# Patient Record
Sex: Male | Born: 1950 | Race: Black or African American | Hispanic: No | Marital: Single | Smoking: Former smoker
Health system: Southern US, Community
[De-identification: ages and names within clinical notes are randomized; demographics above are authoritative.]

## PROBLEM LIST (undated history)

## (undated) DIAGNOSIS — E785 Hyperlipidemia, unspecified: Secondary | ICD-10-CM

## (undated) DIAGNOSIS — N529 Male erectile dysfunction, unspecified: Secondary | ICD-10-CM

## (undated) DIAGNOSIS — I10 Essential (primary) hypertension: Secondary | ICD-10-CM

## (undated) HISTORY — DX: Essential (primary) hypertension: I10

## (undated) HISTORY — DX: Male erectile dysfunction, unspecified: N52.9

## (undated) HISTORY — DX: Hyperlipidemia, unspecified: E78.5

---

## 2007-04-19 ENCOUNTER — Ambulatory Visit: Payer: Self-pay | Admitting: Gastroenterology

## 2010-06-02 HISTORY — PX: COLONOSCOPY: SHX174

## 2014-11-28 ENCOUNTER — Encounter: Payer: Self-pay | Admitting: Family Medicine

## 2014-11-28 ENCOUNTER — Ambulatory Visit (INDEPENDENT_AMBULATORY_CARE_PROVIDER_SITE_OTHER): Payer: Self-pay | Admitting: Family Medicine

## 2014-11-28 VITALS — BP 120/80 | HR 72 | Ht 69.0 in | Wt 173.0 lb

## 2014-11-28 DIAGNOSIS — N529 Male erectile dysfunction, unspecified: Secondary | ICD-10-CM

## 2014-11-28 DIAGNOSIS — E785 Hyperlipidemia, unspecified: Secondary | ICD-10-CM | POA: Insufficient documentation

## 2014-11-28 DIAGNOSIS — I10 Essential (primary) hypertension: Secondary | ICD-10-CM

## 2014-11-28 MED ORDER — HYDROCHLOROTHIAZIDE 25 MG PO TABS: 25.0000 mg | ORAL_TABLET | Freq: Every day | ORAL | Status: DC

## 2014-11-28 MED ORDER — SILDENAFIL CITRATE 100 MG PO TABS: 50.0000 mg | ORAL_TABLET | Freq: Every day | ORAL | Status: DC | PRN

## 2014-11-28 MED ORDER — LOVASTATIN 20 MG PO TABS: 20.0000 mg | ORAL_TABLET | Freq: Every day | ORAL | Status: DC

## 2014-11-28 NOTE — Progress Notes (Signed)
Name: Fred Cordova   MRN: [id number]    DOB: Aug 26, 1950   Date:11/28/2014       Progress Note  Subjective  Chief Complaint  Chief Complaint  Patient presents with  . Hypertension  . Hyperlipidemia    Hypertension This is a chronic problem. The current episode started more than 1 year ago. The problem has been resolved since onset. The problem is controlled. Pertinent negatives include no anxiety, blurred vision, chest pain, headaches, malaise/fatigue, neck pain, orthopnea, palpitations, peripheral edema, PND, shortness of breath or sweats. There are no associated agents to hypertension. There are no known risk factors for coronary artery disease. Past treatments include nothing. The current treatment provides no improvement. There are no compliance problems.  There is no history of angina, kidney disease, CAD/MI, CVA, heart failure, left ventricular hypertrophy or PVD. There is no history of chronic renal disease.  Hyperlipidemia This is a recurrent problem. The current episode started more than 1 year ago. The problem is controlled. Recent lipid tests were reviewed and are low. He has no history of chronic renal disease, diabetes, hypothyroidism, liver disease, obesity or nephrotic syndrome. There are no known factors aggravating his hyperlipidemia. Pertinent negatives include no chest pain, focal sensory loss, focal weakness, leg pain, myalgias or shortness of breath. He is currently on no antihyperlipidemic treatment. The current treatment provides mild improvement of lipids. There are no compliance problems.  There are no known risk factors for coronary artery disease.    No problem-specific assessment & plan notes found for this encounter.   Past Medical History  Diagnosis Date  . Hypertension   . Hyperlipidemia     Past Surgical History  Procedure Laterality Date  . Colonoscopy  2012    repeat in [facility]    History reviewed. No pertinent family history.  History   Social  History  . Marital Status: Single    Spouse Name: N/A  . Number of Children: N/A  . Years of Education: N/A   Occupational History  . Not on file.   Social History Main Topics  . Smoking status: Former Research scientist (life sciences)  . Smokeless tobacco: Not on file  . Alcohol Use: 0.0 oz/week    0 Standard drinks or equivalent per week  . Drug Use: No  . Sexual Activity: No   Other Topics Concern  . Not on file   Social History Narrative  . No narrative on file    No Known Allergies   Review of Systems  Constitutional: Negative for fever, chills, weight loss and malaise/fatigue.  HENT: Negative for ear discharge, ear pain and sore throat.   Eyes: Negative for blurred vision.  Respiratory: Negative for cough, sputum production, shortness of breath and wheezing.   Cardiovascular: Negative for chest pain, palpitations, orthopnea, leg swelling and PND.  Gastrointestinal: Negative for heartburn, nausea, abdominal pain, diarrhea, constipation, blood in stool and melena.  Genitourinary: Negative for dysuria, urgency, frequency and hematuria.  Musculoskeletal: Negative for myalgias, back pain, joint pain and neck pain.  Skin: Negative for rash.  Neurological: Negative for dizziness, tingling, sensory change, focal weakness and headaches.  Endo/Heme/Allergies: Negative for environmental allergies and polydipsia. Does not bruise/bleed easily.  Psychiatric/Behavioral: Negative for depression and suicidal ideas. The patient is not nervous/anxious and does not have insomnia.      Objective  Filed Vitals:   11/28/14 1339  BP: 120/80  Pulse: 72  Height: 5\' 9"  (1.753 m)  Weight: 173 lb (78.472 kg)    Physical  Exam  Constitutional: He is oriented to person, place, and time and well-developed, well-nourished, and in no distress.  HENT:  Head: Normocephalic.  Right Ear: External ear normal.  Left Ear: External ear normal.  Nose: Nose normal.  Mouth/Throat: Oropharynx is clear and moist.  Eyes:  Conjunctivae and EOM are normal. Pupils are equal, round, and reactive to light. Right eye exhibits no discharge. Left eye exhibits no discharge. No scleral icterus.  Neck: Normal range of motion. Neck supple. No JVD present. No tracheal deviation present. No thyromegaly present.  Cardiovascular: Normal rate, regular rhythm, normal heart sounds and intact distal pulses.  Exam reveals no gallop and no friction rub.   No murmur heard. Pulmonary/Chest: Breath sounds normal. No respiratory distress. He has no wheezes. He has no rales.  Abdominal: Soft. Bowel sounds are normal. He exhibits no mass. There is no hepatosplenomegaly. There is no tenderness. There is no rebound, no guarding and no CVA tenderness.  Musculoskeletal: Normal range of motion. He exhibits no edema or tenderness.  Lymphadenopathy:    He has no cervical adenopathy.  Neurological: He is alert and oriented to person, place, and time. He has normal sensation, normal strength, normal reflexes and intact cranial nerves. No cranial nerve deficit.  Skin: Skin is warm. No rash noted.  Psychiatric: Mood and affect normal.  Nursing note and vitals reviewed.     Assessment & Plan  Problem List Items Addressed This Visit      Cardiovascular and Mediastinum   Hypertension - Primary   Relevant Medications   aspirin 81 MG tablet   hydrochlorothiazide (HYDRODIURIL) 25 MG tablet   lovastatin (MEVACOR) 20 MG tablet   sildenafil (VIAGRA) 100 MG tablet   Other Relevant Orders   Renal Function Panel   POCT Urinalysis Dipstick     Other   Hyperlipidemia   Relevant Medications   aspirin 81 MG tablet   hydrochlorothiazide (HYDRODIURIL) 25 MG tablet   lovastatin (MEVACOR) 20 MG tablet   sildenafil (VIAGRA) 100 MG tablet   Other Relevant Orders   Lipid Profile    Other Visit Diagnoses    Erectile dysfunction, unspecified erectile dysfunction type        Relevant Medications    sildenafil (VIAGRA) 100 MG tablet         Dr.  [facility]  11/28/2014

## 2014-11-29 LAB — RENAL FUNCTION PANEL
Albumin: 4.4 g/dL (ref 3.6–4.8)
BUN/Creatinine Ratio: 16 (ref 10–22)
BUN: 16 mg/dL (ref 8–27)
CALCIUM: 9.4 mg/dL (ref 8.6–10.2)
CHLORIDE: 99 mmol/L (ref 97–108)
CO2: 27 mmol/L (ref 18–29)
CREATININE: 1 mg/dL (ref 0.76–1.27)
GFR calc Af Amer: 92 mL/min/{1.73_m2} (ref >59)
GFR calc non Af Amer: 80 mL/min/{1.73_m2} (ref >59)
Glucose: 109 mg/dL — ABNORMAL HIGH (ref 65–99)
Phosphorus: 3.6 mg/dL (ref 2.5–4.5)
Potassium: 3.9 mmol/L (ref 3.5–5.2)
Sodium: 143 mmol/L (ref 134–144)

## 2014-11-29 LAB — LIPID PANEL
Chol/HDL Ratio: 2.4 ratio units (ref 0.0–5.0)
Cholesterol, Total: 204 mg/dL — ABNORMAL HIGH (ref 100–199)
HDL: 84 mg/dL (ref >39)
LDL Calculated: 84 mg/dL (ref 0–99)
Triglycerides: 179 mg/dL — ABNORMAL HIGH (ref 0–149)
VLDL CHOLESTEROL CAL: 36 mg/dL (ref 5–40)

## 2014-12-05 ENCOUNTER — Other Ambulatory Visit: Payer: Self-pay | Admitting: Family Medicine

## 2015-06-28 ENCOUNTER — Encounter: Payer: Self-pay | Admitting: Family Medicine

## 2015-06-28 ENCOUNTER — Ambulatory Visit (INDEPENDENT_AMBULATORY_CARE_PROVIDER_SITE_OTHER): Payer: Self-pay | Admitting: Family Medicine

## 2015-06-28 VITALS — BP 122/70 | HR 64 | Ht 69.0 in | Wt 172.0 lb

## 2015-06-28 DIAGNOSIS — N529 Male erectile dysfunction, unspecified: Secondary | ICD-10-CM

## 2015-06-28 DIAGNOSIS — N5201 Erectile dysfunction due to arterial insufficiency: Secondary | ICD-10-CM | POA: Insufficient documentation

## 2015-06-28 DIAGNOSIS — E785 Hyperlipidemia, unspecified: Secondary | ICD-10-CM

## 2015-06-28 DIAGNOSIS — I10 Essential (primary) hypertension: Secondary | ICD-10-CM

## 2015-06-28 MED ORDER — ASPIRIN 81 MG PO TABS: 81.0000 mg | ORAL_TABLET | Freq: Every day | ORAL | Status: AC

## 2015-06-28 MED ORDER — SILDENAFIL CITRATE 100 MG PO TABS: 50.0000 mg | ORAL_TABLET | Freq: Every day | ORAL | Status: DC | PRN

## 2015-06-28 MED ORDER — HYDROCHLOROTHIAZIDE 25 MG PO TABS: 25.0000 mg | ORAL_TABLET | Freq: Every day | ORAL | Status: DC

## 2015-06-28 MED ORDER — LOVASTATIN 20 MG PO TABS: 20.0000 mg | ORAL_TABLET | Freq: Every day | ORAL | Status: DC

## 2015-06-28 NOTE — Progress Notes (Signed)
Name: Fred Cordova   MRN: [id number]    DOB: April 10, 1951   Date:06/28/2015       Progress Note  Subjective  Chief Complaint  Chief Complaint  Patient presents with  . Hypertension  . Hyperlipidemia  . Erectile Dysfunction    Hypertension This is a chronic problem. The current episode started more than 1 year ago. The problem has been gradually improving since onset. The problem is controlled. Pertinent negatives include no anxiety, blurred vision, chest pain, headaches, malaise/fatigue, neck pain, orthopnea, palpitations, peripheral edema, PND, shortness of breath or sweats. There are no associated agents to hypertension. There are no known risk factors for coronary artery disease. Past treatments include diuretics. The current treatment provides moderate improvement. There are no compliance problems.  There is no history of angina, kidney disease, CAD/MI, CVA, heart failure, left ventricular hypertrophy, PVD, renovascular disease or retinopathy. There is no history of chronic renal disease or a hypertension causing med.  Hyperlipidemia This is a recurrent problem. The current episode started more than 1 year ago. The problem is controlled. Recent lipid tests were reviewed and are normal. He has no history of chronic renal disease, diabetes, hypothyroidism, liver disease, obesity or nephrotic syndrome. There are no known factors aggravating his hyperlipidemia. Pertinent negatives include no chest pain, focal sensory loss, focal weakness, leg pain, myalgias or shortness of breath. Current antihyperlipidemic treatment includes diet change and statins. The current treatment provides mild improvement of lipids.  Erectile Dysfunction This is a chronic problem. The current episode started more than 1 year ago. The problem has been gradually improving since onset. The nature of his difficulty is achieving erection. He reports no anxiety, decreased libido or performance anxiety. Irritative symptoms do not  include frequency or urgency. Pertinent negatives include no chills, dysuria or hematuria.    No problem-specific assessment & plan notes found for this encounter.   Past Medical History  Diagnosis Date  . Hypertension   . Hyperlipidemia   . Erectile dysfunction     Past Surgical History  Procedure Laterality Date  . Colonoscopy  2012    repeat in 2017[facility]    History reviewed. No pertinent family history.  Social History   Social History  . Marital Status: Single    Spouse Name: N/A  . Number of Children: N/A  . Years of Education: N/A   Occupational History  . Not on file.   Social History Main Topics  . Smoking status: Former Research scientist (life sciences)  . Smokeless tobacco: Not on file  . Alcohol Use: 0.0 oz/week    0 Standard drinks or equivalent per week  . Drug Use: No  . Sexual Activity: No   Other Topics Concern  . Not on file   Social History Narrative    No Known Allergies   Review of Systems  Constitutional: Negative for fever, chills, weight loss and malaise/fatigue.  HENT: Negative for ear discharge, ear pain and sore throat.   Eyes: Negative for blurred vision.  Respiratory: Negative for cough, sputum production, shortness of breath and wheezing.   Cardiovascular: Negative for chest pain, palpitations, orthopnea, leg swelling and PND.  Gastrointestinal: Negative for heartburn, nausea, abdominal pain, diarrhea, constipation, blood in stool and melena.  Genitourinary: Negative for dysuria, urgency, frequency, hematuria and decreased libido.  Musculoskeletal: Negative for myalgias, back pain, joint pain and neck pain.  Skin: Negative for rash.  Neurological: Negative for dizziness, tingling, sensory change, focal weakness and headaches.  Endo/Heme/Allergies: Negative for environmental allergies and  polydipsia. Does not bruise/bleed easily.  Psychiatric/Behavioral: Negative for depression and suicidal ideas. The patient is not nervous/anxious and does not have  insomnia.      Objective  Filed Vitals:   06/28/15 1019  BP: 122/70  Pulse: 64  Height: 5\' 9"  (1.753 m)  Weight: 172 lb (78.019 kg)    Physical Exam  Constitutional: He is oriented to person, place, and time and well-developed, well-nourished, and in no distress.  HENT:  Head: Normocephalic.  Right Ear: External ear normal.  Left Ear: External ear normal.  Nose: Nose normal.  Mouth/Throat: Oropharynx is clear and moist.  Eyes: Conjunctivae and EOM are normal. Pupils are equal, round, and reactive to light. Right eye exhibits no discharge. Left eye exhibits no discharge. No scleral icterus.  Neck: Normal range of motion. Neck supple. No JVD present. No tracheal deviation present. No thyromegaly present.  Cardiovascular: Normal rate, regular rhythm, normal heart sounds and intact distal pulses.  Exam reveals no gallop and no friction rub.   No murmur heard. Pulmonary/Chest: Breath sounds normal. No respiratory distress. He has no wheezes. He has no rales.  Abdominal: Soft. Bowel sounds are normal. He exhibits no mass. There is no hepatosplenomegaly. There is no tenderness. There is no rebound, no guarding and no CVA tenderness.  Musculoskeletal: Normal range of motion. He exhibits no edema or tenderness.  Lymphadenopathy:    He has no cervical adenopathy.  Neurological: He is alert and oriented to person, place, and time. He has normal sensation, normal strength and intact cranial nerves. No cranial nerve deficit.  Skin: Skin is warm. No rash noted.  Psychiatric: Mood and affect normal.  Nursing note and vitals reviewed.     Assessment & Plan  Problem List Items Addressed This Visit      Cardiovascular and Mediastinum   Hypertension - Primary   Relevant Medications   hydrochlorothiazide (HYDRODIURIL) 25 MG tablet   lovastatin (MEVACOR) 20 MG tablet   sildenafil (VIAGRA) 100 MG tablet   aspirin 81 MG tablet   Other Relevant Orders   Renal Function Panel   Erectile  dysfunction due to arterial insufficiency   Relevant Medications   hydrochlorothiazide (HYDRODIURIL) 25 MG tablet   lovastatin (MEVACOR) 20 MG tablet   sildenafil (VIAGRA) 100 MG tablet   aspirin 81 MG tablet     Other   Hyperlipidemia   Relevant Medications   hydrochlorothiazide (HYDRODIURIL) 25 MG tablet   lovastatin (MEVACOR) 20 MG tablet   sildenafil (VIAGRA) 100 MG tablet   aspirin 81 MG tablet   Other Relevant Orders   Lipid Profile    Other Visit Diagnoses    Erectile dysfunction, unspecified erectile dysfunction type        Relevant Medications    sildenafil (VIAGRA) 100 MG tablet         Dr. [facility]  06/28/2015

## 2015-11-28 ENCOUNTER — Other Ambulatory Visit: Payer: Self-pay

## 2015-11-28 DIAGNOSIS — I10 Essential (primary) hypertension: Secondary | ICD-10-CM

## 2015-11-28 DIAGNOSIS — E785 Hyperlipidemia, unspecified: Secondary | ICD-10-CM

## 2015-11-28 MED ORDER — LOVASTATIN 20 MG PO TABS: 20.0000 mg | ORAL_TABLET | Freq: Every day | ORAL | Status: DC

## 2015-11-28 MED ORDER — HYDROCHLOROTHIAZIDE 25 MG PO TABS: 25.0000 mg | ORAL_TABLET | Freq: Every day | ORAL | Status: DC

## 2015-12-06 ENCOUNTER — Encounter: Payer: Self-pay | Admitting: Family Medicine

## 2015-12-06 ENCOUNTER — Ambulatory Visit (INDEPENDENT_AMBULATORY_CARE_PROVIDER_SITE_OTHER): Payer: Self-pay | Admitting: Family Medicine

## 2015-12-06 VITALS — BP 130/80 | HR 64 | Ht 69.0 in | Wt 170.0 lb

## 2015-12-06 DIAGNOSIS — N5201 Erectile dysfunction due to arterial insufficiency: Secondary | ICD-10-CM

## 2015-12-06 DIAGNOSIS — I10 Essential (primary) hypertension: Secondary | ICD-10-CM

## 2015-12-06 DIAGNOSIS — Z1211 Encounter for screening for malignant neoplasm of colon: Secondary | ICD-10-CM

## 2015-12-06 DIAGNOSIS — N529 Male erectile dysfunction, unspecified: Secondary | ICD-10-CM

## 2015-12-06 DIAGNOSIS — E785 Hyperlipidemia, unspecified: Secondary | ICD-10-CM

## 2015-12-06 LAB — HEMOCCULT GUIAC POC 1CARD (OFFICE): Fecal Occult Blood, POC: NEGATIVE

## 2015-12-06 MED ORDER — HYDROCHLOROTHIAZIDE 25 MG PO TABS: 25.0000 mg | ORAL_TABLET | Freq: Every day | ORAL | Status: DC

## 2015-12-06 MED ORDER — LOVASTATIN 20 MG PO TABS: 20.0000 mg | ORAL_TABLET | Freq: Every day | ORAL | Status: DC

## 2015-12-06 MED ORDER — SILDENAFIL CITRATE 100 MG PO TABS: 50.0000 mg | ORAL_TABLET | Freq: Every day | ORAL | Status: DC | PRN

## 2015-12-06 NOTE — Progress Notes (Signed)
Name: Fred Cordova   MRN: [id number]    DOB: 13-Nov-1950   Date:12/06/2015       Progress Note  Subjective  Chief Complaint  Chief Complaint  Patient presents with  . Hypertension  . Hyperlipidemia    Hypertension This is a chronic problem. The current episode started more than 1 year ago. The problem has been gradually worsening since onset. The problem is controlled. Pertinent negatives include no anxiety, blurred vision, chest pain, headaches, malaise/fatigue, neck pain, orthopnea, palpitations, peripheral edema, PND, shortness of breath or sweats. There are no associated agents to hypertension. Risk factors for coronary artery disease include dyslipidemia. Past treatments include diuretics. The current treatment provides mild improvement. There are no compliance problems.  There is no history of angina, kidney disease, CAD/MI, CVA, heart failure, left ventricular hypertrophy, PVD, renovascular disease or retinopathy. There is no history of chronic renal disease or a hypertension causing med.  Hyperlipidemia This is a chronic problem. The current episode started more than 1 year ago. The problem is controlled. Recent lipid tests were reviewed and are normal. He has no history of chronic renal disease, diabetes, hypothyroidism, liver disease, obesity or nephrotic syndrome. There are no known factors aggravating his hyperlipidemia. Pertinent negatives include no chest pain, focal sensory loss, focal weakness, leg pain, myalgias or shortness of breath. Current antihyperlipidemic treatment includes statins. The current treatment provides mild improvement of lipids. There are no compliance problems.  Risk factors for coronary artery disease include hypertension and dyslipidemia.    No problem-specific assessment & plan notes found for this encounter.   Past Medical History  Diagnosis Date  . Hypertension   . Hyperlipidemia   . Erectile dysfunction     Past Surgical History  Procedure  Laterality Date  . Colonoscopy  2012    [facility]    History reviewed. No pertinent family history.  Social History   Social History  . Marital Status: Single    Spouse Name: N/A  . Number of Children: N/A  . Years of Education: N/A   Occupational History  . Not on file.   Social History Main Topics  . Smoking status: Former Research scientist (life sciences)  . Smokeless tobacco: Not on file  . Alcohol Use: 0.0 oz/week    0 Standard drinks or equivalent per week  . Drug Use: No  . Sexual Activity: No   Other Topics Concern  . Not on file   Social History Narrative    No Known Allergies   Review of Systems  Constitutional: Negative for fever, chills, weight loss and malaise/fatigue.  HENT: Negative for ear discharge, ear pain and sore throat.   Eyes: Negative for blurred vision.  Respiratory: Negative for cough, sputum production, shortness of breath and wheezing.   Cardiovascular: Negative for chest pain, palpitations, orthopnea, leg swelling and PND.  Gastrointestinal: Negative for heartburn, nausea, abdominal pain, diarrhea, constipation, blood in stool and melena.  Genitourinary: Negative for dysuria, urgency, frequency and hematuria.  Musculoskeletal: Negative for myalgias, back pain, joint pain and neck pain.  Skin: Negative for rash.  Neurological: Negative for dizziness, tingling, sensory change, focal weakness and headaches.  Endo/Heme/Allergies: Negative for environmental allergies and polydipsia. Does not bruise/bleed easily.  Psychiatric/Behavioral: Negative for depression and suicidal ideas. The patient is not nervous/anxious and does not have insomnia.      Objective  Filed Vitals:   12/06/15 1045  BP: 130/80  Pulse: 64  Height: 5\' 9"  (1.753 m)  Weight: 170 lb (77.111  kg)    Physical Exam  Constitutional: He is oriented to person, place, and time and well-developed, well-nourished, and in no distress.  HENT:  Head: Normocephalic.  Right Ear: External ear  normal.  Left Ear: External ear normal.  Nose: Nose normal.  Mouth/Throat: Oropharynx is clear and moist.  Eyes: Conjunctivae and EOM are normal. Pupils are equal, round, and reactive to light. Right eye exhibits no discharge. Left eye exhibits no discharge. No scleral icterus.  Neck: Normal range of motion. Neck supple. No JVD present. No tracheal deviation present. No thyromegaly present.  Cardiovascular: Normal rate, regular rhythm, normal heart sounds and intact distal pulses.  Exam reveals no gallop and no friction rub.   No murmur heard. Pulmonary/Chest: Breath sounds normal. No respiratory distress. He has no wheezes. He has no rales.  Abdominal: Soft. Bowel sounds are normal. He exhibits no mass. There is no hepatosplenomegaly. There is no tenderness. There is no rebound, no guarding and no CVA tenderness.  Genitourinary: Prostate normal. Guaiac negative stool.  Musculoskeletal: Normal range of motion. He exhibits no edema or tenderness.  Lymphadenopathy:    He has no cervical adenopathy.  Neurological: He is alert and oriented to person, place, and time. He has normal sensation, normal strength, normal reflexes and intact cranial nerves. No cranial nerve deficit.  Skin: Skin is warm. No rash noted.  Psychiatric: Mood and affect normal.  Nursing note and vitals reviewed.     Assessment & Plan  Problem List Items Addressed This Visit      Cardiovascular and Mediastinum   Hypertension - Primary   Relevant Medications   lovastatin (MEVACOR) 20 MG tablet   hydrochlorothiazide (HYDRODIURIL) 25 MG tablet   sildenafil (VIAGRA) 100 MG tablet   Other Relevant Orders   Basic metabolic panel   Erectile dysfunction due to arterial insufficiency   Relevant Medications   lovastatin (MEVACOR) 20 MG tablet   hydrochlorothiazide (HYDRODIURIL) 25 MG tablet   sildenafil (VIAGRA) 100 MG tablet     Other   Hyperlipidemia   Relevant Medications   lovastatin (MEVACOR) 20 MG tablet    hydrochlorothiazide (HYDRODIURIL) 25 MG tablet   sildenafil (VIAGRA) 100 MG tablet   Other Relevant Orders   Lipid Profile    Other Visit Diagnoses    Erectile dysfunction, unspecified erectile dysfunction type        Relevant Medications    sildenafil (VIAGRA) 100 MG tablet    Colon cancer screening        Relevant Orders    POCT Occult Blood Stool (Completed)         Dr. Deanna [facility]  12/06/2015

## 2015-12-08 LAB — LIPID PANEL
CHOL/HDL RATIO: 3.1 ratio (ref 0.0–5.0)
Cholesterol, Total: 270 mg/dL — ABNORMAL HIGH (ref 100–199)
HDL: 88 mg/dL (ref >39)
LDL Calculated: 159 mg/dL — ABNORMAL HIGH (ref 0–99)
TRIGLYCERIDES: 113 mg/dL (ref 0–149)
VLDL CHOLESTEROL CAL: 23 mg/dL (ref 5–40)

## 2015-12-08 LAB — BASIC METABOLIC PANEL
BUN / CREAT RATIO: 18 (ref 10–24)
BUN: 20 mg/dL (ref 8–27)
CHLORIDE: 96 mmol/L (ref 96–106)
CO2: 26 mmol/L (ref 18–29)
Calcium: 9.9 mg/dL (ref 8.6–10.2)
Creatinine, Ser: 1.11 mg/dL (ref 0.76–1.27)
GFR calc non Af Amer: 70 mL/min/{1.73_m2} (ref >59)
GFR, EST AFRICAN AMERICAN: 81 mL/min/{1.73_m2} (ref >59)
GLUCOSE: 95 mg/dL (ref 65–99)
POTASSIUM: 4.5 mmol/L (ref 3.5–5.2)
Sodium: 141 mmol/L (ref 134–144)

## 2015-12-31 ENCOUNTER — Other Ambulatory Visit: Payer: Self-pay

## 2016-01-18 ENCOUNTER — Emergency Department
Admission: EM | Admit: 2016-01-18 | Discharge: 2016-01-18 | Disposition: A | Discharge status: Home / Self Care | Payer: Medicare Other | Attending: Emergency Medicine | Admitting: Emergency Medicine

## 2016-01-18 ENCOUNTER — Emergency Department: Payer: Medicare Other

## 2016-01-18 ENCOUNTER — Encounter: Payer: Self-pay | Admitting: Emergency Medicine

## 2016-01-18 DIAGNOSIS — Z79899 Other long term (current) drug therapy: Secondary | ICD-10-CM | POA: Insufficient documentation

## 2016-01-18 DIAGNOSIS — Z87891 Personal history of nicotine dependence: Secondary | ICD-10-CM | POA: Insufficient documentation

## 2016-01-18 DIAGNOSIS — Y939 Activity, unspecified: Secondary | ICD-10-CM | POA: Insufficient documentation

## 2016-01-18 DIAGNOSIS — Y999 Unspecified external cause status: Secondary | ICD-10-CM | POA: Insufficient documentation

## 2016-01-18 DIAGNOSIS — I10 Essential (primary) hypertension: Secondary | ICD-10-CM | POA: Insufficient documentation

## 2016-01-18 DIAGNOSIS — Y929 Unspecified place or not applicable: Secondary | ICD-10-CM | POA: Insufficient documentation

## 2016-01-18 DIAGNOSIS — S0121XA Laceration without foreign body of nose, initial encounter: Secondary | ICD-10-CM | POA: Diagnosis not present

## 2016-01-18 DIAGNOSIS — Z7982 Long term (current) use of aspirin: Secondary | ICD-10-CM | POA: Diagnosis not present

## 2016-01-18 DIAGNOSIS — I6782 Cerebral ischemia: Secondary | ICD-10-CM | POA: Diagnosis not present

## 2016-01-18 DIAGNOSIS — W1839XA Other fall on same level, initial encounter: Secondary | ICD-10-CM | POA: Insufficient documentation

## 2016-01-18 DIAGNOSIS — S0990XA Unspecified injury of head, initial encounter: Secondary | ICD-10-CM | POA: Diagnosis present

## 2016-01-18 MED ORDER — LIDOCAINE HCL (PF) 1 % IJ SOLN
5.0000 mL | Freq: Once | INTRAMUSCULAR | Status: AC
  Administered 2016-01-18: 5 mL via INTRADERMAL
  Filled 2016-01-18: qty 5

## 2016-01-18 MED ORDER — PENTAFLUOROPROP-TETRAFLUOROETH EX AERO
INHALATION_SPRAY | CUTANEOUS | Status: DC | PRN
  Administered 2016-01-18: 30 via TOPICAL
  Filled 2016-01-18: qty 30

## 2016-01-18 MED ORDER — PENTAFLUOROPROP-TETRAFLUOROETH EX AERO
INHALATION_SPRAY | CUTANEOUS | Status: AC
  Administered 2016-01-18: 30 via TOPICAL
  Filled 2016-01-18: qty 30

## 2016-01-18 MED ORDER — LIDOCAINE HCL (PF) 1 % IJ SOLN
INTRAMUSCULAR | Status: AC
  Administered 2016-01-18: 5 mL via INTRADERMAL
  Filled 2016-01-18: qty 5

## 2016-01-18 NOTE — ED Triage Notes (Addendum)
Patient ambulatory to triage with steady gait, without difficulty or distress noted; pt reports "I drank too many beers and I fell down"; denies any pain but reports "split my nose"; lac noted to left side nare with scant bleeding; dressing in place

## 2016-01-18 NOTE — ED Provider Notes (Signed)
[facility] Emergency Department Provider Note  ____________________________________________   First MD Initiated Contact with Patient 01/18/16 [phone]     (approximate)  I have reviewed the triage vital signs and the nursing notes.   HISTORY  Chief Complaint Laceration    HPI Fred Cordova is a 65 y.o. male presents with nose laceration status post fall. Patient states "I drank too many beers and I filled out". Patient denies any loss of consciousness.   Past Medical History:  Diagnosis Date  . Erectile dysfunction   . Hyperlipidemia   . Hypertension     Patient Active Problem List   Diagnosis Date Noted  . Erectile dysfunction due to arterial insufficiency 06/28/2015  . Hypertension 11/28/2014  . Hyperlipidemia 11/28/2014    Past Surgical History:  Procedure Laterality Date  . COLONOSCOPY  2012   [facility] in 2017[facility]    Prior to Admission medications   Medication Sig Start Date End Date Taking? Authorizing Provider  aspirin 81 MG tablet Take 1 tablet (81 mg total) by mouth daily. 06/28/15   Juline Patch, MD  hydrochlorothiazide (HYDRODIURIL) 25 MG tablet Take 1 tablet (25 mg total) by mouth daily. 12/06/15   Juline Patch, MD  lovastatin (MEVACOR) 20 MG tablet Take 1 tablet (20 mg total) by mouth at bedtime. 12/06/15   Juline Patch, MD  sildenafil (VIAGRA) 100 MG tablet Take 0.5 tablets (50 mg total) by mouth daily as needed for erectile dysfunction. 12/06/15   Juline Patch, MD    Allergies Review of patient's allergies indicates no known allergies.  No family history on file.  Social History Social History  Substance Use Topics  . Smoking status: Former Research scientist (life sciences)  . Smokeless tobacco: Never Used  . Alcohol use 0.0 oz/week    Review of Systems Constitutional: No fever/chills Eyes: No visual changes. ENT: No sore throat. Cardiovascular: Denies chest pain. Respiratory: Denies shortness of breath. Gastrointestinal: No abdominal  pain.  No nausea, no vomiting.  No diarrhea.  No constipation. Genitourinary: Negative for dysuria. Musculoskeletal: Negative for back pain. Skin: Negative for rash. Positive for nose laceration Neurological: Negative for headaches, focal weakness or numbness.  10-point ROS otherwise negative.  ____________________________________________   PHYSICAL EXAM:  VITAL SIGNS: ED Triage Vitals [01/18/16 0128]  Enc Vitals Group     BP 139/80     Pulse Rate 68     Resp 20     Temp 97.9 F (36.6 C)     Temp Source Oral     SpO2 100 %     Weight 165 lb (74.8 kg)     Height 5\' 9"  (1.753 m)     Head Circumference      Peak Flow      Pain Score      Pain Loc      Pain Edu?      Excl. in [place]?     Constitutional: Alert and oriented. Well appearing and in no acute distress. Eyes: Conjunctivae are normal. PERRL. EOMI. Head: Atraumatic. Ears:  Healthy appearing ear canals and TMs bilaterally Nose: No congestion/rhinnorhea.3 cm left Nare linear laceration Mouth/Throat: Mucous membranes are moist.  Oropharynx non-erythematous. Neck: No stridor.  No meningeal signs.   Cardiovascular: Normal rate, regular rhythm. Good peripheral circulation. Grossly normal heart sounds.   Respiratory: Normal respiratory effort.  No retractions. Lungs CTAB. Gastrointestinal: Soft and nontender. No distention.  Musculoskeletal: No lower extremity tenderness nor edema. No gross deformities of extremities. Neurologic:  Normal speech and language. No gross focal neurologic deficits are appreciated.  Skin:  Skin is warm, dry and intact. No rash noted. Psychiatric: Mood and affect are normal. Speech and behavior are normal.    Labs Reviewed - No data to display   RADIOLOGY I, Hyde, personally viewed and evaluated these images (plain radiographs) as part of my medical decision making, as well as reviewing the written report by the radiologist  Ct Head Wo Contrast  Result Date: 01/18/2016 CLINICAL  DATA:  Status post fall, with laceration at the left side of the nose. Initial encounter. EXAM: CT HEAD WITHOUT CONTRAST TECHNIQUE: Contiguous axial images were obtained from the base of the skull through the vertex without intravenous contrast. COMPARISON:  None. FINDINGS: Brain: No evidence of acute infarction, hemorrhage, hydrocephalus, extra-axial collection or mass lesion/mass effect. Mild periventricular white matter likely reflects small vessel ischemic microangiopathy. The posterior fossa, including the cerebellum, brainstem and fourth ventricle, is within normal limits. The third and lateral ventricles, and basal ganglia are unremarkable in appearance. The cerebral hemispheres are symmetric in appearance, with normal gray-white differentiation. No mass effect or midline shift is seen. Vascular: No hyperdense vessel or unexpected calcification. Skull: There is no evidence of fracture; visualized osseous structures are unremarkable in appearance. Sinuses/Orbits: The visualized portions of the orbits are within normal limits. The paranasal sinuses and mastoid air cells are well-aerated. Other: No significant soft tissue abnormalities are seen. IMPRESSION: 1. No evidence of traumatic intracranial injury or fracture. 2. Mild small vessel ischemic microangiopathy. Electronically Signed   By: Garald Balding M.D.   On: 01/18/2016 03:34     .Marland KitchenLaceration Repair Date/Time: 01/18/2016 4:51 AM Performed by: Gregor Hams Authorized by: Gregor Hams   Consent:    Consent obtained:  Verbal   Consent given by:  Patient   Risks discussed:  Poor cosmetic result   Alternatives discussed:  No treatment Anesthesia (see MAR for exact dosages):    Anesthesia method:  Local infiltration   Local anesthetic:  Lidocaine 1% w/o epi Laceration details:    Location:  Face   Face location:  Nose   Length (cm):  3   Depth (mm):  1 Repair type:    Repair type:  Complex Pre-procedure details:    Preparation:   Patient was prepped and draped in usual sterile fashion Exploration:    Contaminated: no   Treatment:    Area cleansed with:  Betadine and saline   Amount of cleaning:  Standard   Scar revision: no   Skin repair:    Repair method:  Sutures   Suture size:  6-0   Suture material:  Nylon Approximation:    Approximation:  Close   Vermilion border: well-aligned   Post-procedure details:    Patient tolerance of procedure:  Tolerated well, no immediate complications Comments:     Laceration through the nasal cartilage as such patient referred to ENT for follow-up       INITIAL IMPRESSION / [place] / ED COURSE  Pertinent labs & imaging results that were available during my care of the patient were reviewed by me and considered in my medical decision making (see chart for details).    Clinical Course    ____________________________________________  FINAL CLINICAL IMPRESSION(S) / ED DIAGNOSES  Final diagnoses:  Nasal laceration, initial encounter     MEDICATIONS GIVEN DURING THIS VISIT:  Medications  pentafluoroprop-tetrafluoroeth (GEBAUERS) aerosol (30 application Topical Given 01/18/16 0235)  lidocaine (PF) (XYLOCAINE)  1 % injection 5 mL (5 mLs Intradermal Given 01/18/16 0235)     NEW OUTPATIENT MEDICATIONS STARTED DURING THIS VISIT:  New Prescriptions   No medications on file      Note:  This document was prepared using Dragon voice recognition software and may include unintentional dictation errors.    Gregor Hams, MD 01/18/16 [phone]

## 2016-01-18 NOTE — ED Notes (Signed)
MD at bedside. Suture cart in room. 6 sutures placed in pts left nostril per Owens Shark, MD.

## 2016-01-23 DIAGNOSIS — S0121XA Laceration without foreign body of nose, initial encounter: Secondary | ICD-10-CM | POA: Diagnosis not present

## 2016-03-24 ENCOUNTER — Other Ambulatory Visit: Payer: Self-pay

## 2016-03-31 ENCOUNTER — Encounter: Payer: Self-pay | Admitting: Family Medicine

## 2016-03-31 ENCOUNTER — Ambulatory Visit (INDEPENDENT_AMBULATORY_CARE_PROVIDER_SITE_OTHER): Payer: Self-pay | Admitting: Family Medicine

## 2016-03-31 VITALS — BP 138/90 | HR 68 | Temp 98.2°F | Ht 69.0 in | Wt 166.0 lb

## 2016-03-31 DIAGNOSIS — J40 Bronchitis, not specified as acute or chronic: Secondary | ICD-10-CM

## 2016-03-31 DIAGNOSIS — J189 Pneumonia, unspecified organism: Secondary | ICD-10-CM

## 2016-03-31 DIAGNOSIS — J181 Lobar pneumonia, unspecified organism: Secondary | ICD-10-CM

## 2016-03-31 DIAGNOSIS — J45909 Unspecified asthma, uncomplicated: Secondary | ICD-10-CM

## 2016-03-31 MED ORDER — GUAIFENESIN-CODEINE 100-10 MG/5ML PO SYRP: 5.0000 mL | ORAL_SOLUTION | Freq: Three times a day (TID) | ORAL | 0 refills | Status: DC | PRN

## 2016-03-31 MED ORDER — AMOXICILLIN-POT CLAVULANATE 875-125 MG PO TABS: 1.0000 | ORAL_TABLET | Freq: Two times a day (BID) | ORAL | 0 refills | Status: DC

## 2016-03-31 MED ORDER — ALBUTEROL SULFATE (2.5 MG/3ML) 0.083% IN NEBU: 2.5000 mg | INHALATION_SOLUTION | Freq: Once | RESPIRATORY_TRACT | Status: AC

## 2016-03-31 MED ORDER — ALBUTEROL SULFATE HFA 108 (90 BASE) MCG/ACT IN AERS: 2.0000 | INHALATION_SPRAY | Freq: Four times a day (QID) | RESPIRATORY_TRACT | 0 refills | Status: DC | PRN

## 2016-03-31 NOTE — Progress Notes (Signed)
Name: Fred Cordova   MRN: [id number]    DOB: 01/30/1951   Date:03/31/2016       Progress Note  Subjective  Chief Complaint  Chief Complaint  Patient presents with  . Sinusitis    cough and cong, "sometime I hear rattling" at night    Sinusitis  This is a new problem. The current episode started in the past 7 days. The problem has been gradually worsening since onset. There has been no fever. Associated symptoms include congestion, coughing, headaches and sinus pressure. Pertinent negatives include no chills, diaphoresis, ear pain, hoarse voice, neck pain, shortness of breath, sneezing, sore throat or swollen glands. Treatments tried: antihistamine. The treatment provided no relief.    No problem-specific Assessment & Plan notes found for this encounter.   Past Medical History:  Diagnosis Date  . Erectile dysfunction   . Hyperlipidemia   . Hypertension     Past Surgical History:  Procedure Laterality Date  . COLONOSCOPY  2012   repeat in 2017[facility]    History reviewed. No pertinent family history.  Social History   Social History  . Marital status: Single    Spouse name: N/A  . Number of children: N/A  . Years of education: N/A   Occupational History  . Not on file.   Social History Main Topics  . Smoking status: Former Research scientist (life sciences)  . Smokeless tobacco: Never Used  . Alcohol use 0.0 oz/week  . Drug use: No  . Sexual activity: No   Other Topics Concern  . Not on file   Social History Narrative  . No narrative on file    No Known Allergies   Review of Systems  Constitutional: Negative for chills, diaphoresis, fever, malaise/fatigue and weight loss.  HENT: Positive for congestion and sinus pressure. Negative for ear discharge, ear pain, hoarse voice, sneezing and sore throat.   Eyes: Negative for blurred vision.  Respiratory: Positive for cough. Negative for sputum production, shortness of breath and wheezing.   Cardiovascular: Negative for chest pain,  palpitations and leg swelling.  Gastrointestinal: Negative for abdominal pain, blood in stool, constipation, diarrhea, heartburn, melena and nausea.  Genitourinary: Negative for dysuria, frequency, hematuria and urgency.  Musculoskeletal: Negative for back pain, joint pain, myalgias and neck pain.  Skin: Negative for rash.  Neurological: Positive for headaches. Negative for dizziness, tingling, sensory change and focal weakness.  Endo/Heme/Allergies: Negative for environmental allergies and polydipsia. Does not bruise/bleed easily.  Psychiatric/Behavioral: Negative for depression and suicidal ideas. The patient is not nervous/anxious and does not have insomnia.      Objective  Vitals:   03/31/16 1349  BP: 138/90  Pulse: 68  Temp: 98.2 F (36.8 C)  TempSrc: Oral  SpO2: 99%  Weight: 166 lb (75.3 kg)  Height: 5\' 9"  (1.753 m)    Physical Exam  Constitutional: He is oriented to person, place, and time and well-developed, well-nourished, and in no distress.  HENT:  Head: Normocephalic.  Right Ear: External ear normal.  Left Ear: External ear normal.  Nose: Nose normal.  Mouth/Throat: Oropharynx is clear and moist.  Eyes: Conjunctivae and EOM are normal. Pupils are equal, round, and reactive to light. Right eye exhibits no discharge. Left eye exhibits no discharge. No scleral icterus.  Neck: Normal range of motion. Neck supple. No JVD present. No tracheal deviation present. No thyromegaly present.  Cardiovascular: Normal rate, regular rhythm, normal heart sounds and intact distal pulses.  Exam reveals no gallop and no friction rub.  No murmur heard. Pulmonary/Chest: No respiratory distress. He has wheezes. He has rales.  Abdominal: Soft. Bowel sounds are normal. He exhibits no mass. There is no hepatosplenomegaly. There is no tenderness. There is no rebound, no guarding and no CVA tenderness.  Musculoskeletal: Normal range of motion. He exhibits no edema or tenderness.   Lymphadenopathy:    He has no cervical adenopathy.  Neurological: He is alert and oriented to person, place, and time. He has normal sensation, normal strength, normal reflexes and intact cranial nerves. No cranial nerve deficit.  Skin: Skin is warm. No rash noted.  Psychiatric: Mood and affect normal.  Nursing note and vitals reviewed.     Assessment & Plan  Problem List Items Addressed This Visit    None    Visit Diagnoses    Bronchitis    -  Primary   Relevant Medications   amoxicillin-clavulanate (AUGMENTIN) 875-125 MG tablet   guaiFENesin-codeine (ROBITUSSIN AC) 100-10 MG/5ML syrup   Other Relevant Orders   DG Chest 2 View   Mild reactive airways disease, unspecified whether persistent       Relevant Medications   albuterol (PROVENTIL) (2.5 MG/3ML) 0.083% nebulizer solution 2.5 mg   Other Relevant Orders   DG Chest 2 View   Community acquired pneumonia of left lower lobe of lung (HCC)       Relevant Medications   amoxicillin-clavulanate (AUGMENTIN) 875-125 MG tablet   albuterol (PROVENTIL) (2.5 MG/3ML) 0.083% nebulizer solution 2.5 mg   albuterol (PROVENTIL HFA;VENTOLIN HFA) 108 (90 Base) MCG/ACT inhaler   guaiFENesin-codeine (ROBITUSSIN AC) 100-10 MG/5ML syrup   Other Relevant Orders   DG Chest 2 View    I spent 15 minutes with this patient, More than 50% of that time was spent in face to face education, counseling and care coordination.    Dr. [facility]  03/31/16

## 2016-04-02 ENCOUNTER — Ambulatory Visit (INDEPENDENT_AMBULATORY_CARE_PROVIDER_SITE_OTHER): Payer: Medicare Other | Admitting: Family Medicine

## 2016-04-02 ENCOUNTER — Emergency Department: Payer: Medicare Other

## 2016-04-02 ENCOUNTER — Encounter: Payer: Self-pay | Admitting: Family Medicine

## 2016-04-02 VITALS — BP 120/80 | HR 75 | Ht 69.0 in | Wt 163.0 lb

## 2016-04-02 DIAGNOSIS — Z7982 Long term (current) use of aspirin: Secondary | ICD-10-CM | POA: Insufficient documentation

## 2016-04-02 DIAGNOSIS — R55 Syncope and collapse: Secondary | ICD-10-CM | POA: Diagnosis not present

## 2016-04-02 DIAGNOSIS — J40 Bronchitis, not specified as acute or chronic: Secondary | ICD-10-CM | POA: Diagnosis not present

## 2016-04-02 DIAGNOSIS — J189 Pneumonia, unspecified organism: Secondary | ICD-10-CM

## 2016-04-02 DIAGNOSIS — I10 Essential (primary) hypertension: Secondary | ICD-10-CM | POA: Diagnosis not present

## 2016-04-02 DIAGNOSIS — Z79899 Other long term (current) drug therapy: Secondary | ICD-10-CM | POA: Diagnosis not present

## 2016-04-02 DIAGNOSIS — Z87891 Personal history of nicotine dependence: Secondary | ICD-10-CM | POA: Insufficient documentation

## 2016-04-02 DIAGNOSIS — R05 Cough: Secondary | ICD-10-CM | POA: Diagnosis not present

## 2016-04-02 LAB — URINALYSIS COMPLETE WITH MICROSCOPIC (ARMC ONLY)
BACTERIA UA: NONE SEEN
Bilirubin Urine: NEGATIVE
Glucose, UA: NEGATIVE mg/dL
Hgb urine dipstick: NEGATIVE
Leukocytes, UA: NEGATIVE
NITRITE: NEGATIVE
PROTEIN: 100 mg/dL — AB
SPECIFIC GRAVITY, URINE: 1.023 (ref 1.005–1.030)
pH: 7 (ref 5.0–8.0)

## 2016-04-02 LAB — CBC
HEMATOCRIT: 41.2 % (ref 40.0–52.0)
HEMOGLOBIN: 14 g/dL (ref 13.0–18.0)
MCH: 31.9 pg (ref 26.0–34.0)
MCHC: 33.9 g/dL (ref 32.0–36.0)
MCV: 94 fL (ref 80.0–100.0)
Platelets: 411 10*3/uL (ref 150–440)
RBC: 4.38 MIL/uL — ABNORMAL LOW (ref 4.40–5.90)
RDW: 13.2 % (ref 11.5–14.5)
WBC: 9.2 10*3/uL (ref 3.8–10.6)

## 2016-04-02 LAB — BASIC METABOLIC PANEL
ANION GAP: 9 (ref 5–15)
BUN: 17 mg/dL (ref 6–20)
CHLORIDE: 99 mmol/L — AB (ref 101–111)
CO2: 32 mmol/L (ref 22–32)
Calcium: 9.8 mg/dL (ref 8.9–10.3)
Creatinine, Ser: 1.13 mg/dL (ref 0.61–1.24)
GFR calc Af Amer: >60 mL/min (ref >60)
GFR calc non Af Amer: >60 mL/min (ref >60)
GLUCOSE: 161 mg/dL — AB (ref 65–99)
POTASSIUM: 4.5 mmol/L (ref 3.5–5.1)
Sodium: 140 mmol/L (ref 135–145)

## 2016-04-02 LAB — TROPONIN I

## 2016-04-02 NOTE — ED Triage Notes (Addendum)
Patient ambulatory to triage with steady gait, without difficulty, mask in place, congested frequent cough noted; st syncopal episode PTA;st hx of same with no dx; st was coughing with onset; recently dx with pneumonia, currently taking Augmentin since Monday with proventil inhaler; st prod cough brown sputum

## 2016-04-02 NOTE — Progress Notes (Signed)
Name: Fred Cordova   MRN: [id number]    DOB: [birth year]   Date:04/02/2016       Progress Note  Subjective  Chief Complaint  Chief Complaint  Patient presents with  . Follow-up    cough and cong- has improved since starting the antibiotic    Cough  This is a new problem. The current episode started in the past 7 days. The problem has been gradually improving. The problem occurs every few hours. The cough is non-productive. Pertinent negatives include no chest pain, chills, ear congestion, ear pain, fever, headaches, heartburn, hemoptysis, myalgias, nasal congestion, postnasal drip, rash, rhinorrhea, sore throat, shortness of breath, sweats, weight loss or wheezing. Nothing aggravates the symptoms. He has tried a beta-agonist inhaler (antibiotic) for the symptoms. The treatment provided mild relief. There is no history of environmental allergies.    No problem-specific Assessment & Plan notes found for this encounter.   Past Medical History:  Diagnosis Date  . Erectile dysfunction   . Hyperlipidemia   . Hypertension     Past Surgical History:  Procedure Laterality Date  . COLONOSCOPY  2012   [facility]    History reviewed. No pertinent family history.  Social History   Social History  . Marital status: Single    Spouse name: N/A  . Number of children: N/A  . Years of education: N/A   Occupational History  . Not on file.   Social History Main Topics  . Smoking status: Former Research scientist (life sciences)  . Smokeless tobacco: Never Used  . Alcohol use 0.0 oz/week  . Drug use: No  . Sexual activity: No   Other Topics Concern  . Not on file   Social History Narrative  . No narrative on file    No Known Allergies   Review of Systems  Constitutional: Negative for chills, fever, malaise/fatigue and weight loss.  HENT: Negative for ear discharge, ear pain, postnasal drip, rhinorrhea and sore throat.   Eyes: Negative for blurred vision.  Respiratory: Positive for cough.  Negative for hemoptysis, sputum production, shortness of breath and wheezing.   Cardiovascular: Negative for chest pain, palpitations and leg swelling.  Gastrointestinal: Negative for abdominal pain, blood in stool, constipation, diarrhea, heartburn, melena and nausea.  Genitourinary: Negative for dysuria, frequency, hematuria and urgency.  Musculoskeletal: Negative for back pain, joint pain, myalgias and neck pain.  Skin: Negative for rash.  Neurological: Negative for dizziness, tingling, sensory change, focal weakness and headaches.  Endo/Heme/Allergies: Negative for environmental allergies and polydipsia. Does not bruise/bleed easily.  Psychiatric/Behavioral: Negative for depression and suicidal ideas. The patient is not nervous/anxious and does not have insomnia.      Objective  Vitals:   04/02/16 0919  BP: 120/80  Pulse: 75  SpO2: 97%  Weight: 163 lb (73.9 kg)  Height: 5\' 9"  (1.753 m)    Physical Exam  Constitutional: He is oriented to person, place, and time and well-developed, well-nourished, and in no distress.  HENT:  Head: Normocephalic.  Right Ear: External ear normal.  Left Ear: External ear normal.  Nose: Nose normal.  Mouth/Throat: Oropharynx is clear and moist.  Eyes: Conjunctivae and EOM are normal. Pupils are equal, round, and reactive to light. Right eye exhibits no discharge. Left eye exhibits no discharge. No scleral icterus.  Neck: Normal range of motion. Neck supple. No JVD present. No tracheal deviation present. No thyromegaly present.  Cardiovascular: Normal rate, regular rhythm, normal heart sounds and intact distal pulses.  Exam reveals no  gallop and no friction rub.   No murmur heard. Pulmonary/Chest: Breath sounds normal. No respiratory distress. He has no wheezes. He has no rales.  Abdominal: Soft. Bowel sounds are normal. He exhibits no mass. There is no hepatosplenomegaly. There is no tenderness. There is no rebound, no guarding and no CVA  tenderness.  Musculoskeletal: Normal range of motion. He exhibits no edema or tenderness.  Lymphadenopathy:    He has no cervical adenopathy.  Neurological: He is alert and oriented to person, place, and time. He has normal sensation, normal strength, normal reflexes and intact cranial nerves. No cranial nerve deficit.  Skin: Skin is warm. No rash noted.  Psychiatric: Mood and affect normal.      Assessment & Plan  Problem List Items Addressed This Visit    None    Visit Diagnoses    Pneumonia due to infectious organism, unspecified laterality, unspecified part of lung    -  Primary   improving     I spent 15 minutes with this patient, More than 50% of that time was spent in face to face education, counseling and care coordination.   Dr. [facility]  04/02/16

## 2016-04-03 ENCOUNTER — Emergency Department
Admission: EM | Admit: 2016-04-03 | Discharge: 2016-04-03 | Disposition: A | Discharge status: Home / Self Care | Payer: Medicare Other | Attending: Emergency Medicine | Admitting: Emergency Medicine

## 2016-04-03 DIAGNOSIS — R55 Syncope and collapse: Secondary | ICD-10-CM

## 2016-04-03 DIAGNOSIS — J40 Bronchitis, not specified as acute or chronic: Secondary | ICD-10-CM

## 2016-04-03 MED ORDER — PREDNISONE 20 MG PO TABS
60.0000 mg | ORAL_TABLET | Freq: Once | ORAL | Status: AC
  Administered 2016-04-03: 60 mg via ORAL
  Filled 2016-04-03: qty 3

## 2016-04-03 MED ORDER — PREDNISONE 20 MG PO TABS: ORAL_TABLET | ORAL | 0 refills | Status: DC

## 2016-04-03 MED ORDER — HYDROCOD POLST-CPM POLST ER 10-8 MG/5ML PO SUER: 5.0000 mL | Freq: Two times a day (BID) | ORAL | 0 refills | Status: DC

## 2016-04-03 MED ORDER — SODIUM CHLORIDE 0.9 % IV BOLUS (SEPSIS)
1000.0000 mL | Freq: Once | INTRAVENOUS | Status: AC
  Administered 2016-04-03: 1000 mL via INTRAVENOUS

## 2016-04-03 MED ORDER — IPRATROPIUM-ALBUTEROL 0.5-2.5 (3) MG/3ML IN SOLN
3.0000 mL | Freq: Once | RESPIRATORY_TRACT | Status: AC
  Administered 2016-04-03: 3 mL via RESPIRATORY_TRACT
  Filled 2016-04-03: qty 3

## 2016-04-03 MED ORDER — HYDROCOD POLST-CPM POLST ER 10-8 MG/5ML PO SUER
5.0000 mL | Freq: Once | ORAL | Status: AC
  Administered 2016-04-03: 5 mL via ORAL
  Filled 2016-04-03: qty 5

## 2016-04-03 NOTE — Discharge Instructions (Signed)
1. Continue and finish antibiotic as prescribed by your doctor. 2. Take prednisone 60 mg daily 4 days. Start your next dose on Friday. 3. Stop taking Robitussin for cough. Instead you may take Tussionex as needed for cough. 4. You may use the inhaler your doctor prescribed 2 puffs every 4 hours as needed for wheezing. 5. Return to the ER for worsening symptoms, persistent vomiting, difficult to breathing or other concerns.

## 2016-04-03 NOTE — ED Notes (Signed)
Pt was able to change positions with no reports of lightheadedness or dizziness.

## 2016-04-03 NOTE — ED Provider Notes (Signed)
[facility] Emergency Department Provider Note   ____________________________________________   First MD Initiated Contact with Patient 04/03/16 0021     (approximate)  I have reviewed the triage vital signs and the nursing notes.   HISTORY  Chief Complaint Loss of Consciousness    HPI Fred Cordova is a 65 y.o. male who presents to the ED from home with a chief complain of syncope. Patient reports a several day history of congestion, hacking dry cough. Diagnosed with pneumonia by his PCP and started Augmentin 3 days ago along with albuterol inhaler. Reports tonight getting up from the kitchen table, coughing a lot and had a brief syncopal episode. Denies associated fever, chills, headache, vision changes, neck pain, chest pain, shortness of breath, abdominal pain, nausea, vomiting, diarrhea. Denies recent travel or trauma. Nothing makes his symptoms better or worse.   Past Medical History:  Diagnosis Date  . Erectile dysfunction   . Hyperlipidemia   . Hypertension     Patient Active Problem List   Diagnosis Date Noted  . Erectile dysfunction due to arterial insufficiency 06/28/2015  . Hypertension 11/28/2014  . Hyperlipidemia 11/28/2014    Past Surgical History:  Procedure Laterality Date  . COLONOSCOPY  2012   [facility]    Prior to Admission medications   Medication Sig Start Date End Date Taking? Authorizing Provider  albuterol (PROVENTIL HFA;VENTOLIN HFA) 108 (90 Base) MCG/ACT inhaler Inhale 2 puffs into the lungs every 6 (six) hours as needed for wheezing or shortness of breath. 03/31/16   Juline Patch, MD  amoxicillin-clavulanate (AUGMENTIN) 875-125 MG tablet Take 1 tablet by mouth 2 (two) times daily. 03/31/16   Juline Patch, MD  aspirin 81 MG tablet Take 1 tablet (81 mg total) by mouth daily. 06/28/15   Juline Patch, MD  chlorpheniramine-HYDROcodone (TUSSIONEX PENNKINETIC ER) 10-8 MG/5ML SUER Take 5 mLs by mouth 2 (two)  times daily. 04/03/16   Paulette Blanch, MD  guaiFENesin-codeine (ROBITUSSIN AC) 100-10 MG/5ML syrup Take 5 mLs by mouth 3 (three) times daily as needed for cough. 03/31/16   Juline Patch, MD  hydrochlorothiazide (HYDRODIURIL) 25 MG tablet Take 1 tablet (25 mg total) by mouth daily. 12/06/15   Juline Patch, MD  lovastatin (MEVACOR) 20 MG tablet Take 1 tablet (20 mg total) by mouth at bedtime. 12/06/15   Juline Patch, MD  predniSONE (DELTASONE) 20 MG tablet 3 tablets daily x 4 days 04/03/16   Paulette Blanch, MD  sildenafil (VIAGRA) 100 MG tablet Take 0.5 tablets (50 mg total) by mouth daily as needed for erectile dysfunction. 12/06/15   Juline Patch, MD    Allergies Review of patient's allergies indicates no known allergies.  No family history on file.  Social History Social History  Substance Use Topics  . Smoking status: Former Research scientist (life sciences)  . Smokeless tobacco: Never Used  . Alcohol use 0.0 oz/week    Review of Systems  Constitutional: No fever/chills. Eyes: No visual changes. ENT: No sore throat. Cardiovascular: Denies chest pain. Respiratory: Positive for nonproductive cough. Denies shortness of breath. Gastrointestinal: No abdominal pain.  No nausea, no vomiting.  No diarrhea.  No constipation. Genitourinary: Negative for dysuria. Musculoskeletal: Negative for back pain. Skin: Negative for rash. Neurological: Positive for syncope. Negative for headaches, focal weakness or numbness.  10-point ROS otherwise negative.  ____________________________________________   PHYSICAL EXAM:  VITAL SIGNS: ED Triage Vitals  Enc Vitals Group     BP 04/02/16  2040 (!) 153/88     Pulse Rate 04/02/16 2040 82     Resp 04/02/16 2040 (!) 22     Temp 04/02/16 2040 97.5 F (36.4 C)     Temp src --      SpO2 04/02/16 2040 98 %     Weight 04/02/16 2037 163 lb (73.9 kg)     Height 04/02/16 2037 5\' 9"  (1.753 m)     Head Circumference --      Peak Flow --      Pain Score --      Pain Loc --       Pain Edu? --      Excl. in [place]? --     Constitutional: Alert and oriented. Well appearing and in no acute distress. Eyes: Conjunctivae are normal. PERRL. EOMI. Head: Atraumatic. Nose: Congestion/rhinnorhea. Mouth/Throat: Mucous membranes are moist.  Oropharynx non-erythematous. Neck: No stridor.  No cervical spine tenderness to palpation. Hematological/Lymphatic/Immunilogical: No cervical lymphadenopathy. Cardiovascular: Normal rate, regular rhythm. Grossly normal heart sounds.  Good peripheral circulation. Respiratory: Normal respiratory effort.  No retractions. Lungs with mild wheeze and rhonchi left lower lobe. Gastrointestinal: Soft and nontender. No distention. No abdominal bruits. No CVA tenderness. Musculoskeletal: No lower extremity tenderness nor edema.  No joint effusions. Neurologic:  Normal speech and language. No gross focal neurologic deficits are appreciated. No gait instability. Skin:  Skin is warm, dry and intact. No rash noted. Psychiatric: Mood and affect are normal. Speech and behavior are normal.  ____________________________________________   LABS (all labs ordered are listed, but only abnormal results are displayed)  Labs Reviewed  BASIC METABOLIC PANEL - Abnormal; Notable for the following:       Result Value   Chloride 99 (*)    Glucose, Bld 161 (*)    All other components within normal limits  CBC - Abnormal; Notable for the following:    RBC 4.38 (*)    All other components within normal limits  URINALYSIS COMPLETEWITH MICROSCOPIC ([facility] ONLY) - Abnormal; Notable for the following:    Color, Urine YELLOW (*)    APPearance CLEAR (*)    Ketones, ur TRACE (*)    Protein, ur 100 (*)    Squamous Epithelial / LPF 0-5 (*)    All other components within normal limits  TROPONIN I   ____________________________________________  EKG  ED ECG REPORT I, SUNG,JADE J, the attending physician, personally viewed and interpreted this ECG.   Date: 04/03/2016   EKG Time: 2037  Rate: 91  Rhythm: normal EKG, normal sinus rhythm  Axis: Normal  Intervals:none  ST&T Change: Nonspecific  ____________________________________________  RADIOLOGY  Chest x-ray (viewed by me, interpreted per Dr. Francoise Ceo): No active cardiopulmonary disease. ____________________________________________   PROCEDURES  Procedure(s) performed: None  Procedures  Critical Care performed: No  ____________________________________________   INITIAL IMPRESSION / ASSESSMENT AND PLAN / ED COURSE  Pertinent labs & imaging results that were available during my care of the patient were reviewed by me and considered in my medical decision making (see chart for details).  65 year old male currently on Augmentin for bronchitis who presents status post syncopal episode during forceful coughing. Laboratory, chest x-ray and EKG are unremarkable. Will infuse IV fluids, Tussionex, DuoNeb, prednisone and reassess.  Clinical Course  Comment By Time  Patient feeling much better after DuoNeb. Coarse lung sounds have cleared after DuoNeb. Will keep patient on prednisone for a total of 5 days. He is to continue and finish antibiotic as prescribed by his doctor.  Will prescribe Tussionex to substitute for the cough medicine and his doctor gave him. Strict return precautions given. Patient's spouse verbalize understanding and agree with plan of care. Paulette Blanch, MD 11/02 [phone]     ____________________________________________   FINAL CLINICAL IMPRESSION(S) / ED DIAGNOSES  Final diagnoses:  Syncope, unspecified syncope type  Bronchitis      NEW MEDICATIONS STARTED DURING THIS VISIT:  New Prescriptions   CHLORPHENIRAMINE-HYDROCODONE (TUSSIONEX PENNKINETIC ER) 10-8 MG/5ML SUER    Take 5 mLs by mouth 2 (two) times daily.   PREDNISONE (DELTASONE) 20 MG TABLET    3 tablets daily x 4 days     Note:  This document was prepared using Dragon voice recognition software and may include  unintentional dictation errors.    Paulette Blanch, MD 04/03/16 [phone]

## 2016-04-09 ENCOUNTER — Ambulatory Visit (INDEPENDENT_AMBULATORY_CARE_PROVIDER_SITE_OTHER): Payer: Medicare Other | Admitting: Family Medicine

## 2016-04-09 ENCOUNTER — Encounter: Payer: Self-pay | Admitting: Family Medicine

## 2016-04-09 VITALS — BP 124/70 | HR 64 | Temp 98.1°F | Ht 69.0 in | Wt 166.0 lb

## 2016-04-09 DIAGNOSIS — J40 Bronchitis, not specified as acute or chronic: Secondary | ICD-10-CM | POA: Diagnosis not present

## 2016-04-09 NOTE — Progress Notes (Signed)
Name: Fred Cordova   MRN: [id number]    DOB: Oct 31, 1950   Date:04/09/2016       Progress Note  Subjective  Chief Complaint  Chief Complaint  Patient presents with  . Follow-up    seen in ER on 11/2- Dx bronchitis- was given IV antibiotics    Cough  This is a new problem. The current episode started 1 to 4 weeks ago. The problem has been gradually improving. The problem occurs every few minutes. The cough is non-productive. Pertinent negatives include no chest pain, chills, ear congestion, ear pain, fever, headaches, heartburn, hemoptysis, myalgias, nasal congestion, postnasal drip, rash, rhinorrhea, sore throat, shortness of breath, sweats, weight loss or wheezing. The symptoms are aggravated by lying down. Risk factors for lung disease include animal exposure. He has tried nothing for the symptoms. The treatment provided mild relief. There is no history of asthma, bronchiectasis, bronchitis, COPD, emphysema, environmental allergies or pneumonia.    No problem-specific Assessment & Plan notes found for this encounter.   Past Medical History:  Diagnosis Date  . Erectile dysfunction   . Hyperlipidemia   . Hypertension     Past Surgical History:  Procedure Laterality Date  . COLONOSCOPY  2012   repeat in 2017- [place]    No family history on file.  Social History   Social History  . Marital status: Single    Spouse name: N/A  . Number of children: N/A  . Years of education: N/A   Occupational History  . Not on file.   Social History Main Topics  . Smoking status: Former Research scientist (life sciences)  . Smokeless tobacco: Never Used  . Alcohol use 0.0 oz/week  . Drug use: No  . Sexual activity: No   Other Topics Concern  . Not on file   Social History Narrative  . No narrative on file    No Known Allergies   Review of Systems  Constitutional: Negative for chills, fever, malaise/fatigue and weight loss.  HENT: Negative for ear discharge, ear pain, postnasal drip, rhinorrhea and sore  throat.   Eyes: Negative for blurred vision.  Respiratory: Positive for cough. Negative for hemoptysis, sputum production, shortness of breath and wheezing.   Cardiovascular: Negative for chest pain, palpitations and leg swelling.  Gastrointestinal: Negative for abdominal pain, blood in stool, constipation, diarrhea, heartburn, melena and nausea.  Genitourinary: Negative for dysuria, frequency, hematuria and urgency.  Musculoskeletal: Negative for back pain, joint pain, myalgias and neck pain.  Skin: Negative for rash.  Neurological: Negative for dizziness, tingling, sensory change, focal weakness and headaches.  Endo/Heme/Allergies: Negative for environmental allergies and polydipsia. Does not bruise/bleed easily.  Psychiatric/Behavioral: Negative for depression and suicidal ideas. The patient is not nervous/anxious and does not have insomnia.      Objective  Vitals:   04/09/16 0959  BP: 124/70  Pulse: 64  Temp: 98.1 F (36.7 C)  TempSrc: Oral  SpO2: 98%  Weight: 166 lb (75.3 kg)  Height: 5\' 9"  (1.753 m)    Physical Exam  Constitutional: He is oriented to person, place, and time and well-developed, well-nourished, and in no distress.  HENT:  Head: Normocephalic.  Right Ear: External ear normal.  Left Ear: External ear normal.  Nose: Nose normal.  Mouth/Throat: Oropharynx is clear and moist.  Eyes: Conjunctivae and EOM are normal. Pupils are equal, round, and reactive to light. Right eye exhibits no discharge. Left eye exhibits no discharge. No scleral icterus.  Neck: Normal range of motion. Neck supple. No JVD  present. No tracheal deviation present. No thyromegaly present.  Cardiovascular: Normal rate, regular rhythm, normal heart sounds and intact distal pulses.  Exam reveals no gallop and no friction rub.   No murmur heard. Pulmonary/Chest: Breath sounds normal. No respiratory distress. He has no wheezes. He has no rales.  Abdominal: Soft. Bowel sounds are normal. He  exhibits no mass. There is no hepatosplenomegaly. There is no tenderness. There is no rebound, no guarding and no CVA tenderness.  Musculoskeletal: Normal range of motion. He exhibits no edema or tenderness.  Lymphadenopathy:    He has no cervical adenopathy.  Neurological: He is alert and oriented to person, place, and time. He has normal sensation, normal strength, normal reflexes and intact cranial nerves. No cranial nerve deficit.  Skin: Skin is warm. No rash noted.  Psychiatric: Mood and affect normal.      Assessment & Plan  Problem List Items Addressed This Visit    None    Visit Diagnoses    Bronchitis    -  Primary    I spent 15 minutes with this patient, More than 50% of that time was spent in face to face education, counseling and care coordination.    Dr. [facility]  04/09/16

## 2016-06-10 ENCOUNTER — Encounter: Payer: Self-pay | Admitting: Family Medicine

## 2016-06-10 ENCOUNTER — Ambulatory Visit (INDEPENDENT_AMBULATORY_CARE_PROVIDER_SITE_OTHER): Payer: Medicare Other | Admitting: Family Medicine

## 2016-06-10 VITALS — BP 120/80 | HR 78 | Ht 69.0 in | Wt 174.0 lb

## 2016-06-10 DIAGNOSIS — E781 Pure hyperglyceridemia: Secondary | ICD-10-CM | POA: Diagnosis not present

## 2016-06-10 DIAGNOSIS — I10 Essential (primary) hypertension: Secondary | ICD-10-CM

## 2016-06-10 DIAGNOSIS — Z23 Encounter for immunization: Secondary | ICD-10-CM | POA: Diagnosis not present

## 2016-06-10 MED ORDER — HYDROCHLOROTHIAZIDE 25 MG PO TABS: 25.0000 mg | ORAL_TABLET | Freq: Every day | ORAL | 3 refills | Status: DC

## 2016-06-10 NOTE — Progress Notes (Signed)
Name: Fred Cordova   MRN: [id number]    DOB: May 25, 1951   Date:06/10/2016       Progress Note  Subjective  Chief Complaint  Chief Complaint  Patient presents with  . Hypertension  . Hyperlipidemia    Hypertension  This is a chronic problem. The current episode started more than 1 year ago. The problem has been gradually improving since onset. The problem is controlled. Pertinent negatives include no anxiety, blurred vision, chest pain, headaches, malaise/fatigue, neck pain, orthopnea, palpitations, peripheral edema, PND, shortness of breath or sweats. There are no associated agents to hypertension. There are no known risk factors for coronary artery disease. Past treatments include diuretics. The current treatment provides moderate improvement. Compliance problems include diet, medication cost, exercise, medication side effects and psychosocial issues.  There is no history of angina, kidney disease, CAD/MI, CVA, heart failure, left ventricular hypertrophy, PVD, renovascular disease or retinopathy. There is no history of chronic renal disease or a hypertension causing med.  Hyperlipidemia  The current episode started more than 1 year ago. The problem is controlled. Recent lipid tests were reviewed and are normal. He has no history of chronic renal disease, diabetes, hypothyroidism, liver disease, obesity or nephrotic syndrome. There are no known factors aggravating his hyperlipidemia. Pertinent negatives include no chest pain, focal sensory loss, focal weakness, leg pain, myalgias or shortness of breath. He is currently on no antihyperlipidemic treatment. The current treatment provides mild improvement of lipids. There are no compliance problems.  Risk factors for coronary artery disease include dyslipidemia.    No problem-specific Assessment & Plan notes found for this encounter.   Past Medical History:  Diagnosis Date  . Erectile dysfunction   . Hyperlipidemia   . Hypertension     Past  Surgical History:  Procedure Laterality Date  . COLONOSCOPY  2012   repeat in 2017- [place]    No family history on file.  Social History   Social History  . Marital status: Single    Spouse name: N/A  . Number of children: N/A  . Years of education: N/A   Occupational History  . Not on file.   Social History Main Topics  . Smoking status: Former Research scientist (life sciences)  . Smokeless tobacco: Never Used  . Alcohol use 0.0 oz/week  . Drug use: No  . Sexual activity: No   Other Topics Concern  . Not on file   Social History Narrative  . No narrative on file    No Known Allergies   Review of Systems  Constitutional: Negative for chills, fever, malaise/fatigue and weight loss.  HENT: Negative for ear discharge, ear pain and sore throat.   Eyes: Negative for blurred vision.  Respiratory: Negative for cough, sputum production, shortness of breath and wheezing.   Cardiovascular: Negative for chest pain, palpitations, orthopnea, leg swelling and PND.  Gastrointestinal: Negative for abdominal pain, blood in stool, constipation, diarrhea, heartburn, melena and nausea.  Genitourinary: Negative for dysuria, frequency, hematuria and urgency.  Musculoskeletal: Negative for back pain, joint pain, myalgias and neck pain.  Skin: Negative for rash.  Neurological: Negative for dizziness, tingling, sensory change, focal weakness and headaches.  Endo/Heme/Allergies: Negative for environmental allergies and polydipsia. Does not bruise/bleed easily.  Psychiatric/Behavioral: Negative for depression and suicidal ideas. The patient is not nervous/anxious and does not have insomnia.      Objective  Vitals:   06/10/16 0911  BP: 120/80  Pulse: 78  Weight: 174 lb (78.9 kg)  Height: 5\' 9"  (1.753 m)  Physical Exam  Constitutional: He is oriented to person, place, and time and well-developed, well-nourished, and in no distress.  HENT:  Head: Normocephalic.  Right Ear: External ear normal.  Left Ear:  External ear normal.  Nose: Nose normal.  Mouth/Throat: Oropharynx is clear and moist.  Eyes: Conjunctivae and EOM are normal. Pupils are equal, round, and reactive to light. Right eye exhibits no discharge. Left eye exhibits no discharge. No scleral icterus.  Neck: Normal range of motion. Neck supple. No JVD present. No tracheal deviation present. No thyromegaly present.  Cardiovascular: Normal rate, regular rhythm, normal heart sounds and intact distal pulses.  Exam reveals no gallop and no friction rub.   No murmur heard. Pulmonary/Chest: Breath sounds normal. No respiratory distress. He has no wheezes. He has no rales.  Abdominal: Soft. Bowel sounds are normal. He exhibits no mass. There is no hepatosplenomegaly. There is no tenderness. There is no rebound, no guarding and no CVA tenderness.  Musculoskeletal: Normal range of motion. He exhibits no edema or tenderness.  Lymphadenopathy:    He has no cervical adenopathy.  Neurological: He is alert and oriented to person, place, and time. He has normal sensation, normal strength, normal reflexes and intact cranial nerves. No cranial nerve deficit.  Skin: Skin is warm. No rash noted.  Psychiatric: Mood and affect normal.  Nursing note and vitals reviewed.     Assessment & Plan  Problem List Items Addressed This Visit      Cardiovascular and Mediastinum   Hypertension - Primary   Relevant Medications   hydrochlorothiazide (HYDRODIURIL) 25 MG tablet     Other   Hyperlipidemia   Relevant Medications   hydrochlorothiazide (HYDRODIURIL) 25 MG tablet    Other Visit Diagnoses    Immunization due       Need for pneumococcal vaccination       Relevant Orders   Pneumococcal conjugate vaccine 13-valent (Completed)        Dr. [facility]  06/10/16

## 2016-07-22 ENCOUNTER — Other Ambulatory Visit: Payer: Self-pay

## 2016-09-11 ENCOUNTER — Emergency Department
Admission: EM | Admit: 2016-09-11 | Discharge: 2016-09-11 | Disposition: A | Discharge status: Home / Self Care | Payer: Medicare Other | Attending: Emergency Medicine | Admitting: Emergency Medicine

## 2016-09-11 ENCOUNTER — Emergency Department: Payer: Medicare Other

## 2016-09-11 ENCOUNTER — Encounter: Payer: Self-pay | Admitting: *Deleted

## 2016-09-11 DIAGNOSIS — I10 Essential (primary) hypertension: Secondary | ICD-10-CM | POA: Diagnosis not present

## 2016-09-11 DIAGNOSIS — Z7982 Long term (current) use of aspirin: Secondary | ICD-10-CM | POA: Diagnosis not present

## 2016-09-11 DIAGNOSIS — R05 Cough: Secondary | ICD-10-CM | POA: Diagnosis not present

## 2016-09-11 DIAGNOSIS — Z87891 Personal history of nicotine dependence: Secondary | ICD-10-CM | POA: Diagnosis not present

## 2016-09-11 DIAGNOSIS — Z79899 Other long term (current) drug therapy: Secondary | ICD-10-CM | POA: Diagnosis not present

## 2016-09-11 DIAGNOSIS — J40 Bronchitis, not specified as acute or chronic: Secondary | ICD-10-CM | POA: Diagnosis not present

## 2016-09-11 DIAGNOSIS — R0981 Nasal congestion: Secondary | ICD-10-CM | POA: Diagnosis present

## 2016-09-11 MED ORDER — BENZONATATE 100 MG PO CAPS: 100.0000 mg | ORAL_CAPSULE | Freq: Three times a day (TID) | ORAL | 0 refills | Status: DC | PRN

## 2016-09-11 MED ORDER — AZITHROMYCIN 250 MG PO TABS: ORAL_TABLET | ORAL | 0 refills | Status: AC

## 2016-09-11 NOTE — ED Provider Notes (Signed)
[facility] Emergency Department Provider Note   ____________________________________________   First MD Initiated Contact with Patient 09/11/16 1046     (approximate)  I have reviewed the triage vital signs and the nursing notes.   HISTORY  Chief Complaint Nasal Congestion    HPI Fred Cordova is a 66 y.o. male patient complaining of cough and chest congestion for 3 weeks. Patient denies any fever at this complaint. Patient has nasal congestion runny nose. Patient state cough is productive.Patient denies pain with this complaint. Patient states recently diagnosed hypertension has started taking hydrochlorothiazide. No palliative measures for his cough.   Past Medical History:  Diagnosis Date  . Erectile dysfunction   . Hyperlipidemia   . Hypertension     Patient Active Problem List   Diagnosis Date Noted  . Erectile dysfunction due to arterial insufficiency 06/28/2015  . Hypertension 11/28/2014  . Hyperlipidemia 11/28/2014    Past Surgical History:  Procedure Laterality Date  . COLONOSCOPY  2012   [facility]    Prior to Admission medications   Medication Sig Start Date End Date Taking? Authorizing Provider  aspirin 81 MG tablet Take 1 tablet (81 mg total) by mouth daily. 06/28/15   Juline Patch, MD  azithromycin (ZITHROMAX Z-PAK) 250 MG tablet Take 2 tablets (500 mg) on  Day 1,  followed by 1 tablet (250 mg) once daily on Days 2 through 5. 09/11/16 09/16/16  Sable Feil, PA-C  benzonatate (TESSALON PERLES) 100 MG capsule Take 1 capsule (100 mg total) by mouth 3 (three) times daily as needed for cough. 09/11/16 09/11/17  Sable Feil, PA-C  hydrochlorothiazide (HYDRODIURIL) 25 MG tablet Take 1 tablet (25 mg total) by mouth daily. 06/10/16   Juline Patch, MD  lovastatin (MEVACOR) 20 MG tablet Take 1 tablet (20 mg total) by mouth at bedtime. 12/06/15   Juline Patch, MD  sildenafil (VIAGRA) 100 MG tablet Take 0.5 tablets (50 mg  total) by mouth daily as needed for erectile dysfunction. Patient not taking: Reported on 06/10/2016 12/06/15   Juline Patch, MD    Allergies Patient has no known allergies.  No family history on file.  Social History Social History  Substance Use Topics  . Smoking status: Former Research scientist (life sciences)  . Smokeless tobacco: Never Used  . Alcohol use 0.0 oz/week    Review of Systems Constitutional: No fever/chills Eyes: No visual changes. ENT: No sore throat. Cardiovascular: Denies chest pain. Respiratory: Denies shortness of breath. Productive cough Gastrointestinal: No abdominal pain.  No nausea, no vomiting.  No diarrhea.  No constipation. Genitourinary: Negative for dysuria. Musculoskeletal: Negative for back pain. Skin: Negative for rash. Neurological: Negative for headaches, focal weakness or numbness. Endocrine:Hypertension hyperlipidemia.  ____________________________________________   PHYSICAL EXAM:  VITAL SIGNS: ED Triage Vitals [09/11/16 1038]  Enc Vitals Group     BP 137/80     Pulse Rate 78     Resp 20     Temp 97.7 F (36.5 C)     Temp Source Oral     SpO2 97 %     Weight 174 lb (78.9 kg)     Height 5\' 9"  (1.753 m)     Head Circumference      Peak Flow      Pain Score      Pain Loc      Pain Edu?      Excl. in [place]?     Constitutional: Alert and oriented. Well  appearing and in no acute distress. Eyes: Conjunctivae are normal. PERRL. EOMI. Head: Atraumatic. Nose: No congestion/rhinnorhea. Mouth/Throat: Mucous membranes are moist.  Oropharynx non-erythematous. Neck: No stridor.  No cervical spine tenderness to palpation. Hematological/Lymphatic/Immunilogical: No cervical lymphadenopathy. Cardiovascular: Normal rate, regular rhythm. Grossly normal heart sounds.  Good peripheral circulation. Respiratory: Normal respiratory effort.  No retractions. Lungs CTAB. Gastrointestinal: Soft and nontender. No distention. No abdominal bruits. No CVA  tenderness. Musculoskeletal: No lower extremity tenderness nor edema.  No joint effusions. Neurologic:  Normal speech and language. No gross focal neurologic deficits are appreciated. No gait instability. Skin:  Skin is warm, dry and intact. No rash noted. Psychiatric: Mood and affect are normal. Speech and behavior are normal.  ____________________________________________   LABS (all labs ordered are listed, but only abnormal results are displayed)  Labs Reviewed - No data to display ____________________________________________  EKG   ____________________________________________  RADIOLOGY  X-ray findings consistent with bronchitis. ____________________________________________   PROCEDURES  Procedure(s) performed: None  Procedures  Critical Care performed: No  ____________________________________________   INITIAL IMPRESSION / ASSESSMENT AND PLAN / ED COURSE  Pertinent labs & imaging results that were available during my care of the patient were reviewed by me and considered in my medical decision making (see chart for details).  Bronchitis. Chest x-ray is pending      ____________________________________________   FINAL CLINICAL IMPRESSION(S) / ED DIAGNOSES  Final diagnoses:  Bronchitis  Patient given discharge care instructions. Patient given a prescription for Zithromax and Tessalon Perles. Patient advised follow-up family doctor if condition persists.    NEW MEDICATIONS STARTED DURING THIS VISIT:  New Prescriptions   AZITHROMYCIN (ZITHROMAX Z-PAK) 250 MG TABLET    Take 2 tablets (500 mg) on  Day 1,  followed by 1 tablet (250 mg) once daily on Days 2 through 5.   BENZONATATE (TESSALON PERLES) 100 MG CAPSULE    Take 1 capsule (100 mg total) by mouth 3 (three) times daily as needed for cough.     Note:  This document was prepared using Dragon voice recognition software and may include unintentional dictation errors.    Sable Feil,  PA-C 09/11/16 1120    Eula Listen, MD 09/11/16 [phone]

## 2016-09-11 NOTE — ED Triage Notes (Signed)
Pt complains of cough and congestion for 3 weeks, pt denies fever

## 2016-10-02 DIAGNOSIS — R05 Cough: Secondary | ICD-10-CM | POA: Diagnosis not present

## 2016-12-08 ENCOUNTER — Ambulatory Visit (INDEPENDENT_AMBULATORY_CARE_PROVIDER_SITE_OTHER): Payer: Medicare Other | Admitting: Family Medicine

## 2016-12-08 ENCOUNTER — Other Ambulatory Visit: Payer: Self-pay | Admitting: Family Medicine

## 2016-12-08 ENCOUNTER — Encounter: Payer: Self-pay | Admitting: Family Medicine

## 2016-12-08 VITALS — BP 120/64 | HR 64 | Ht 69.0 in | Wt 172.0 lb

## 2016-12-08 DIAGNOSIS — Z114 Encounter for screening for human immunodeficiency virus [HIV]: Secondary | ICD-10-CM

## 2016-12-08 DIAGNOSIS — Z1159 Encounter for screening for other viral diseases: Secondary | ICD-10-CM

## 2016-12-08 DIAGNOSIS — E7800 Pure hypercholesterolemia, unspecified: Secondary | ICD-10-CM | POA: Diagnosis not present

## 2016-12-08 DIAGNOSIS — I10 Essential (primary) hypertension: Secondary | ICD-10-CM

## 2016-12-08 DIAGNOSIS — N529 Male erectile dysfunction, unspecified: Secondary | ICD-10-CM

## 2016-12-08 MED ORDER — HYDROCHLOROTHIAZIDE 25 MG PO TABS: 25.0000 mg | ORAL_TABLET | Freq: Every day | ORAL | 3 refills | Status: AC

## 2016-12-08 MED ORDER — LOVASTATIN 20 MG PO TABS: 20.0000 mg | ORAL_TABLET | Freq: Every day | ORAL | 6 refills | Status: DC

## 2016-12-08 MED ORDER — SILDENAFIL CITRATE 100 MG PO TABS: 50.0000 mg | ORAL_TABLET | Freq: Every day | ORAL | 11 refills | Status: AC | PRN

## 2016-12-08 NOTE — Progress Notes (Signed)
Name: Fred Cordova   MRN: [id number]    DOB: [birth year]   Date:12/08/2016       Progress Note  Subjective  Chief Complaint  Chief Complaint  Patient presents with  . Hypertension  . Hyperlipidemia    Hypertension  This is a chronic problem. The current episode started more than 1 year ago. The problem is unchanged. The problem is controlled. Pertinent negatives include no anxiety, blurred vision, chest pain, headaches, malaise/fatigue, neck pain, orthopnea, palpitations, peripheral edema, PND, shortness of breath or sweats. There are no associated agents to hypertension. Risk factors for coronary artery disease include male gender. Past treatments include diuretics. The current treatment provides moderate improvement. There are no compliance problems.  There is no history of angina, kidney disease, CAD/MI, CVA, heart failure, left ventricular hypertrophy, PVD or retinopathy. There is no history of chronic renal disease, a hypertension causing med or renovascular disease.  Hyperlipidemia  This is a chronic problem. The problem is controlled. Recent lipid tests were reviewed and are normal. He has no history of chronic renal disease or obesity. Factors aggravating his hyperlipidemia include thiazides. Pertinent negatives include no chest pain, focal weakness, myalgias or shortness of breath.    No problem-specific Assessment & Plan notes found for this encounter.   Past Medical History:  Diagnosis Date  . Erectile dysfunction   . Hyperlipidemia   . Hypertension     Past Surgical History:  Procedure Laterality Date  . COLONOSCOPY  2012   repeat in 2017- [place]    No family history on file.  Social History   Social History  . Marital status: Single    Spouse name: N/A  . Number of children: N/A  . Years of education: N/A   Occupational History  . Not on file.   Social History Main Topics  . Smoking status: Former Research scientist (life sciences)  . Smokeless tobacco: Never Used  . Alcohol use 0.0  oz/week  . Drug use: No  . Sexual activity: No   Other Topics Concern  . Not on file   Social History Narrative  . No narrative on file    No Known Allergies  Outpatient Medications Prior to Visit  Medication Sig Dispense Refill  . aspirin 81 MG tablet Take 1 tablet (81 mg total) by mouth daily. 30 tablet 11  . hydrochlorothiazide (HYDRODIURIL) 25 MG tablet Take 1 tablet (25 mg total) by mouth daily. 90 tablet 3  . benzonatate (TESSALON PERLES) 100 MG capsule Take 1 capsule (100 mg total) by mouth 3 (three) times daily as needed for cough. 30 capsule 0  . lovastatin (MEVACOR) 20 MG tablet Take 1 tablet (20 mg total) by mouth at bedtime. (Patient not taking: Reported on 12/08/2016) 30 tablet 6  . sildenafil (VIAGRA) 100 MG tablet Take 0.5 tablets (50 mg total) by mouth daily as needed for erectile dysfunction. (Patient not taking: Reported on 06/10/2016) 5 tablet 11   Facility-Administered Medications Prior to Visit  Medication Dose Route Frequency Provider Last Rate Last Dose  . albuterol (PROVENTIL) (2.5 MG/3ML) 0.083% nebulizer solution 2.5 mg  2.5 mg Nebulization Once Juline Patch, MD        Review of Systems  Constitutional: Negative for chills, fever, malaise/fatigue and weight loss.  HENT: Negative for ear discharge, ear pain and sore throat.   Eyes: Negative for blurred vision.  Respiratory: Negative for cough, sputum production, shortness of breath and wheezing.   Cardiovascular: Negative for chest pain, palpitations, orthopnea, leg swelling and  PND.  Gastrointestinal: Negative for abdominal pain, blood in stool, constipation, diarrhea, heartburn, melena and nausea.  Genitourinary: Negative for dysuria, frequency, hematuria and urgency.  Musculoskeletal: Negative for back pain, joint pain, myalgias and neck pain.  Skin: Negative for rash.  Neurological: Negative for dizziness, tingling, sensory change, focal weakness and headaches.  Endo/Heme/Allergies: Negative for  environmental allergies and polydipsia. Does not bruise/bleed easily.  Psychiatric/Behavioral: Negative for depression and suicidal ideas. The patient is not nervous/anxious and does not have insomnia.      Objective  Vitals:   12/08/16 0910  BP: 120/64  Pulse: 64  Weight: 172 lb (78 kg)  Height: 5\' 9"  (1.753 m)    Physical Exam  Constitutional: He is oriented to person, place, and time and well-developed, well-nourished, and in no distress.  HENT:  Head: Normocephalic.  Right Ear: External ear normal.  Left Ear: External ear normal.  Nose: Nose normal.  Mouth/Throat: Oropharynx is clear and moist.  Eyes: Conjunctivae and EOM are normal. Pupils are equal, round, and reactive to light. Right eye exhibits no discharge. Left eye exhibits no discharge. No scleral icterus.  Neck: Normal range of motion. Neck supple. No JVD present. No tracheal deviation present. No thyromegaly present.  Cardiovascular: Normal rate, regular rhythm, S1 normal, S2 normal, normal heart sounds and intact distal pulses.  Exam reveals no gallop, no S3, no S4 and no friction rub.   No murmur heard. Pulmonary/Chest: Breath sounds normal. No respiratory distress. He has no wheezes. He has no rales.  Abdominal: Soft. Bowel sounds are normal. He exhibits no mass. There is no hepatosplenomegaly. There is no tenderness. There is no rebound, no guarding and no CVA tenderness.  Musculoskeletal: Normal range of motion. He exhibits no edema or tenderness.  Lymphadenopathy:    He has no cervical adenopathy.  Neurological: He is alert and oriented to person, place, and time. He has normal sensation, normal strength, normal reflexes and intact cranial nerves. No cranial nerve deficit.  Skin: Skin is warm. No rash noted.  Psychiatric: Mood and affect normal.  Nursing note and vitals reviewed.     Assessment & Plan  Problem List Items Addressed This Visit      Cardiovascular and Mediastinum   Hypertension - Primary    Relevant Medications   hydrochlorothiazide (HYDRODIURIL) 25 MG tablet   sildenafil (VIAGRA) 100 MG tablet   lovastatin (MEVACOR) 20 MG tablet   Other Relevant Orders   Renal function panel     Other   Hyperlipidemia   Relevant Medications   hydrochlorothiazide (HYDRODIURIL) 25 MG tablet   sildenafil (VIAGRA) 100 MG tablet   lovastatin (MEVACOR) 20 MG tablet   Other Relevant Orders   Lipid Profile    Other Visit Diagnoses    Erectile dysfunction, unspecified erectile dysfunction type       Relevant Medications   sildenafil (VIAGRA) 100 MG tablet   Need for hepatitis C screening test       Relevant Orders   Hepatitis C antibody   Encounter for screening for HIV       Relevant Orders   HIV antibody      Meds ordered this encounter  Medications  . hydrochlorothiazide (HYDRODIURIL) 25 MG tablet    Sig: Take 1 tablet (25 mg total) by mouth daily.    Dispense:  90 tablet    Refill:  3  . sildenafil (VIAGRA) 100 MG tablet    Sig: Take 0.5 tablets (50 mg total) by mouth daily as  needed for erectile dysfunction.    Dispense:  5 tablet    Refill:  11  . lovastatin (MEVACOR) 20 MG tablet    Sig: Take 1 tablet (20 mg total) by mouth at bedtime.    Dispense:  30 tablet    Refill:  6      Dr. Otilio Miu [facility]  12/08/16

## 2016-12-09 LAB — RENAL FUNCTION PANEL
ALBUMIN: 5.1 g/dL — AB (ref 3.6–4.8)
BUN/Creatinine Ratio: 13 (ref 10–24)
BUN: 13 mg/dL (ref 8–27)
CHLORIDE: 94 mmol/L — AB (ref 96–106)
CO2: 24 mmol/L (ref 20–29)
Calcium: 10.2 mg/dL (ref 8.6–10.2)
Creatinine, Ser: 1.04 mg/dL (ref 0.76–1.27)
GFR calc Af Amer: 87 mL/min/{1.73_m2} (ref >59)
GFR, EST NON AFRICAN AMERICAN: 75 mL/min/{1.73_m2} (ref >59)
GLUCOSE: 116 mg/dL — AB (ref 65–99)
PHOSPHORUS: 3.1 mg/dL (ref 2.5–4.5)
POTASSIUM: 4.1 mmol/L (ref 3.5–5.2)
SODIUM: 143 mmol/L (ref 134–144)

## 2016-12-09 LAB — HIV ANTIBODY (ROUTINE TESTING W REFLEX): HIV Screen 4th Generation wRfx: NONREACTIVE

## 2016-12-09 LAB — LIPID PANEL
CHOL/HDL RATIO: 2.7 ratio (ref 0.0–5.0)
CHOLESTEROL TOTAL: 293 mg/dL — AB (ref 100–199)
HDL: 107 mg/dL (ref >39)
LDL Calculated: 167 mg/dL — ABNORMAL HIGH (ref 0–99)
TRIGLYCERIDES: 96 mg/dL (ref 0–149)
VLDL Cholesterol Cal: 19 mg/dL (ref 5–40)

## 2016-12-09 LAB — HEPATITIS C ANTIBODY: Hep C Virus Ab: <0.1 s/co ratio (ref 0.0–0.9)

## 2016-12-15 ENCOUNTER — Other Ambulatory Visit: Payer: Self-pay

## 2017-07-24 ENCOUNTER — Other Ambulatory Visit: Payer: Self-pay

## 2017-11-20 IMAGING — CR DG CHEST 2V
1 series · 2 of 2 positions shown · non-contrast
Comparison: Chest x-ray of 04/02/2016

CLINICAL DATA: Productive cough for 3 weeks

EXAM:
CHEST  2 VIEW

[Series 1: w chest pa · 0.14mm/px · 2 of 2 slices shown]
[im 1/2]
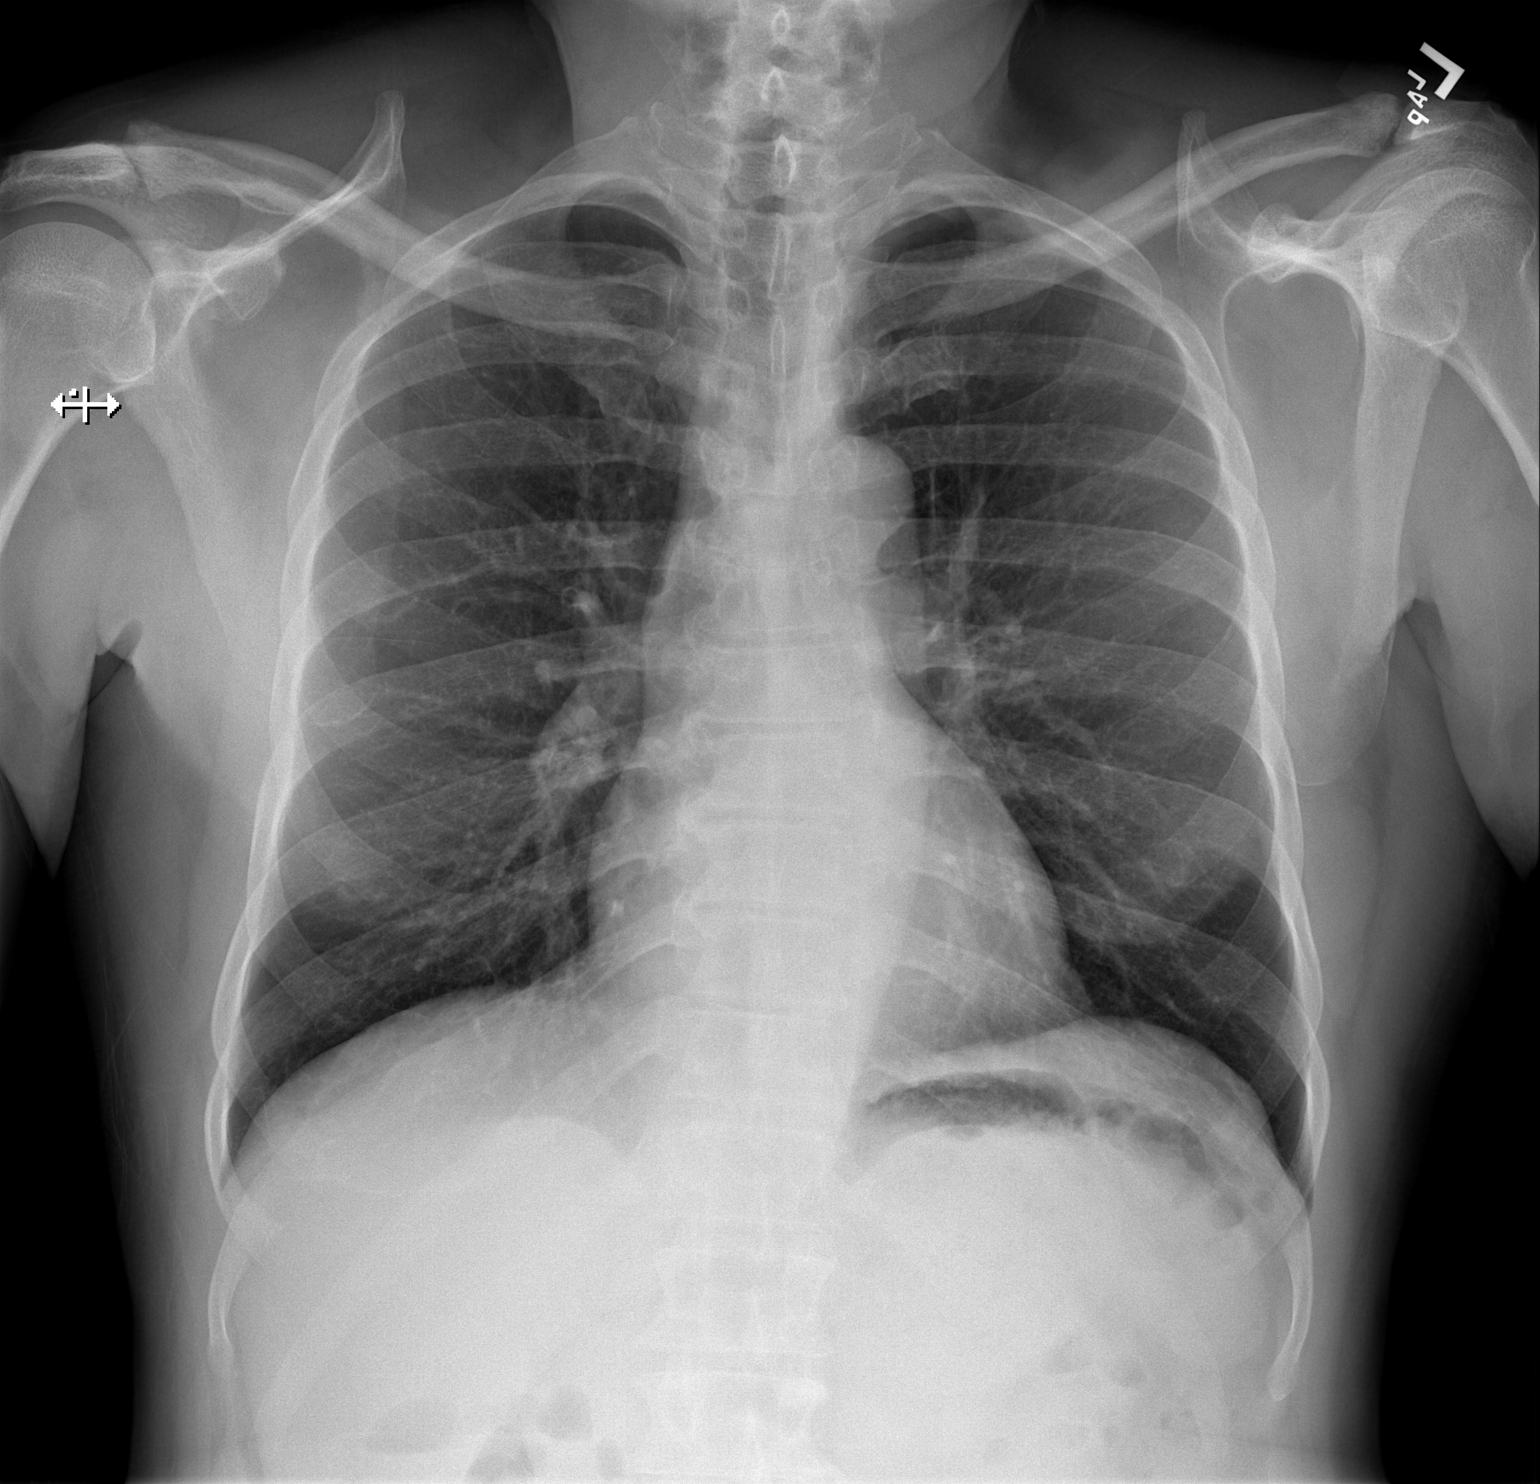
[im 2/2]
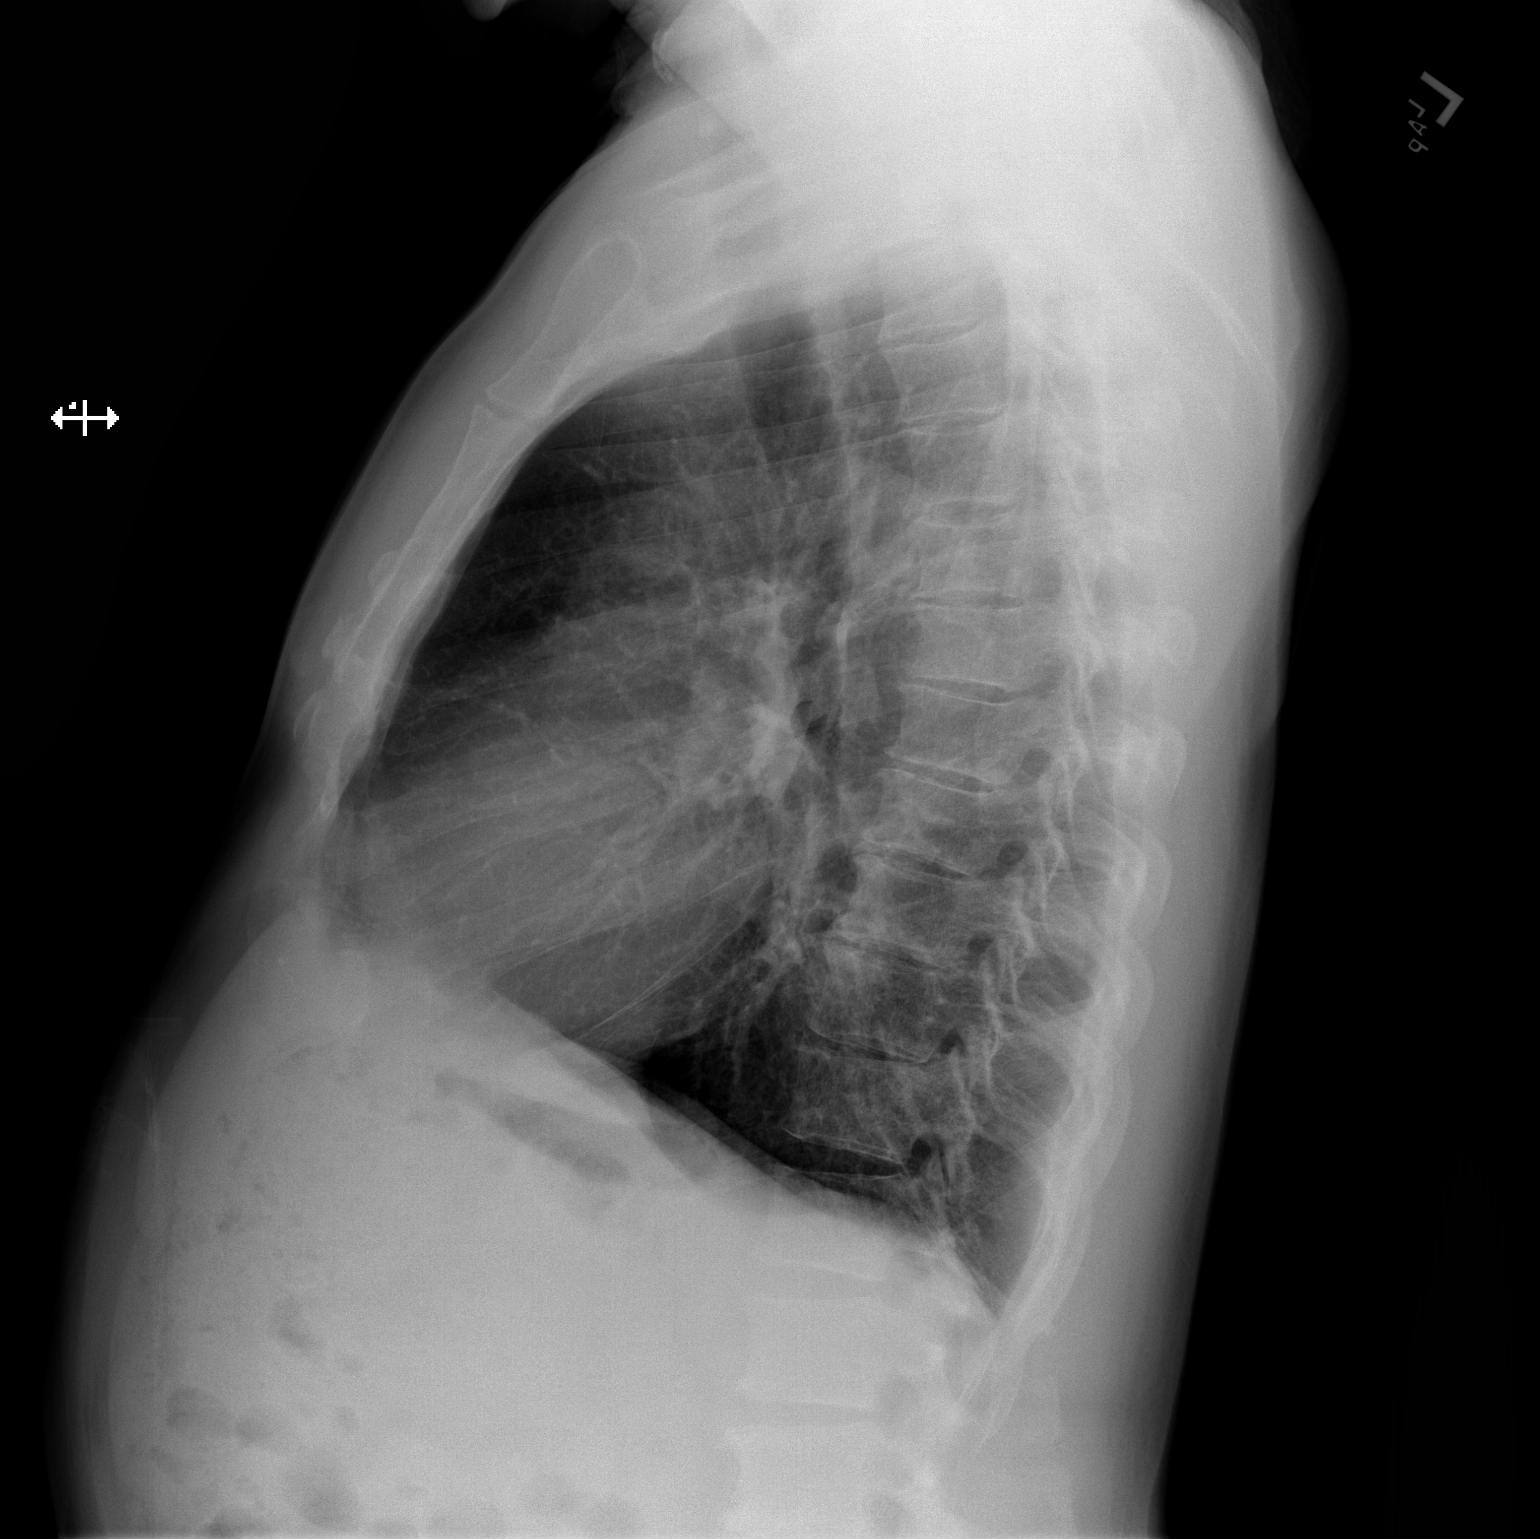

[2 of 2 positions shown; findings below may reference images not displayed]

FINDINGS: No active infiltrate or effusion is seen. There is some
peribronchial thickening present which may indicate bronchitis.
Mediastinal and hilar contours are unremarkable. The heart is within
normal limits in size. No bony abnormality is seen.
IMPRESSION: No pneumonia.  Peribronchial thickening may indicate bronchitis.

## 2018-02-10 ENCOUNTER — Other Ambulatory Visit: Payer: Self-pay

## 2018-02-10 DIAGNOSIS — Z1211 Encounter for screening for malignant neoplasm of colon: Secondary | ICD-10-CM

## 2018-02-24 ENCOUNTER — Other Ambulatory Visit: Payer: Self-pay

## 2018-02-24 ENCOUNTER — Telehealth: Payer: Self-pay | Admitting: Gastroenterology

## 2018-02-24 MED ORDER — NA SULFATE-K SULFATE-MG SULF 17.5-3.13-1.6 GM/177ML PO SOLN
1.0000 | Freq: Once | ORAL | 0 refills | Status: DC
Start: 2018-02-24 — End: 2018-02-24

## 2018-02-24 MED ORDER — PEG 3350-KCL-NABCB-NACL-NASULF 236 G PO SOLR: 4000.0000 mL | Freq: Once | ORAL | 0 refills | Status: AC

## 2018-02-24 NOTE — Telephone Encounter (Signed)
Pt left vm  He missed a call from someone

## 2018-02-25 ENCOUNTER — Encounter
Admission: RE | Disposition: A | Discharge status: Home / Self Care | Payer: Self-pay | Source: Ambulatory Visit | Attending: Gastroenterology

## 2018-02-25 ENCOUNTER — Encounter: Payer: Self-pay | Admitting: *Deleted

## 2018-02-25 ENCOUNTER — Ambulatory Visit: Payer: Medicare Other | Admitting: Certified Registered Nurse Anesthetist

## 2018-02-25 ENCOUNTER — Ambulatory Visit
Admission: RE | Admit: 2018-02-25 | Discharge: 2018-02-25 | Disposition: A | Discharge status: Home / Self Care | Payer: Medicare Other | Source: Ambulatory Visit | Attending: Gastroenterology | Admitting: Gastroenterology

## 2018-02-25 DIAGNOSIS — Z1211 Encounter for screening for malignant neoplasm of colon: Secondary | ICD-10-CM

## 2018-02-25 DIAGNOSIS — K648 Other hemorrhoids: Secondary | ICD-10-CM | POA: Diagnosis not present

## 2018-02-25 DIAGNOSIS — I10 Essential (primary) hypertension: Secondary | ICD-10-CM | POA: Diagnosis not present

## 2018-02-25 DIAGNOSIS — K635 Polyp of colon: Secondary | ICD-10-CM | POA: Diagnosis not present

## 2018-02-25 DIAGNOSIS — E785 Hyperlipidemia, unspecified: Secondary | ICD-10-CM | POA: Insufficient documentation

## 2018-02-25 DIAGNOSIS — D127 Benign neoplasm of rectosigmoid junction: Secondary | ICD-10-CM | POA: Diagnosis not present

## 2018-02-25 DIAGNOSIS — Z79899 Other long term (current) drug therapy: Secondary | ICD-10-CM | POA: Insufficient documentation

## 2018-02-25 DIAGNOSIS — Z7982 Long term (current) use of aspirin: Secondary | ICD-10-CM | POA: Diagnosis not present

## 2018-02-25 DIAGNOSIS — Z86010 Personal history of colonic polyps: Secondary | ICD-10-CM | POA: Insufficient documentation

## 2018-02-25 DIAGNOSIS — K649 Unspecified hemorrhoids: Secondary | ICD-10-CM | POA: Diagnosis not present

## 2018-02-25 DIAGNOSIS — N529 Male erectile dysfunction, unspecified: Secondary | ICD-10-CM | POA: Insufficient documentation

## 2018-02-25 DIAGNOSIS — Z87891 Personal history of nicotine dependence: Secondary | ICD-10-CM | POA: Insufficient documentation

## 2018-02-25 DIAGNOSIS — D124 Benign neoplasm of descending colon: Secondary | ICD-10-CM

## 2018-02-25 HISTORY — PX: COLONOSCOPY WITH PROPOFOL: SHX5780

## 2018-02-25 SURGERY — COLONOSCOPY WITH PROPOFOL
Anesthesia: General

## 2018-02-25 MED ORDER — PROPOFOL 500 MG/50ML IV EMUL
INTRAVENOUS | Status: AC
  Filled 2018-02-25: qty 50

## 2018-02-25 MED ORDER — LIDOCAINE HCL (PF) 2 % IJ SOLN
INTRAMUSCULAR | Status: AC
  Filled 2018-02-25: qty 10

## 2018-02-25 MED ORDER — PROPOFOL 10 MG/ML IV BOLUS
INTRAVENOUS | Status: DC | PRN
  Administered 2018-02-25: 100 mg via INTRAVENOUS

## 2018-02-25 MED ORDER — PROPOFOL 500 MG/50ML IV EMUL
INTRAVENOUS | Status: DC | PRN
  Administered 2018-02-25: 140 ug/kg/min via INTRAVENOUS

## 2018-02-25 MED ORDER — MIDAZOLAM HCL 2 MG/2ML IJ SOLN
INTRAMUSCULAR | Status: AC
  Filled 2018-02-25: qty 2

## 2018-02-25 MED ORDER — MIDAZOLAM HCL 2 MG/2ML IJ SOLN
INTRAMUSCULAR | Status: DC | PRN
  Administered 2018-02-25: 2 mg via INTRAVENOUS

## 2018-02-25 MED ORDER — LIDOCAINE HCL (CARDIAC) PF 100 MG/5ML IV SOSY
PREFILLED_SYRINGE | INTRAVENOUS | Status: DC | PRN
  Administered 2018-02-25: 50 mg via INTRAVENOUS

## 2018-02-25 MED ORDER — SODIUM CHLORIDE 0.9 % IV SOLN
INTRAVENOUS | Status: DC
  Administered 2018-02-25: 1000 mL via INTRAVENOUS

## 2018-02-25 NOTE — Anesthesia Post-op Follow-up Note (Signed)
Anesthesia QCDR form completed.        

## 2018-02-25 NOTE — Transfer of Care (Signed)
Immediate Anesthesia Transfer of Care Note  Patient: Fred Cordova  Procedure(s) Performed: COLONOSCOPY WITH PROPOFOL (N/A )  Patient Location: PACU and Endoscopy Unit  Anesthesia Type:General  Level of Consciousness: drowsy  Airway & Oxygen Therapy: Patient Spontanous Breathing  Post-op Assessment: Report given to RN and Post -op Vital signs reviewed and stable  Post vital signs: Reviewed and stable  Last Vitals:  Vitals Value Taken Time  BP    Temp    Pulse    Resp    SpO2      Last Pain:  Vitals:   02/25/18 0957  TempSrc: Tympanic  PainSc: 0-No pain         Complications: No apparent anesthesia complications

## 2018-02-25 NOTE — Anesthesia Postprocedure Evaluation (Signed)
Anesthesia Post Note  Patient: Fred Cordova  Procedure(s) Performed: COLONOSCOPY WITH PROPOFOL (N/A )  Patient location during evaluation: Endoscopy Anesthesia Type: General Level of consciousness: awake and alert Pain management: pain level controlled Vital Signs Assessment: post-procedure vital signs reviewed and stable Respiratory status: spontaneous breathing, nonlabored ventilation, respiratory function stable and patient connected to nasal cannula oxygen Cardiovascular status: blood pressure returned to baseline and stable Postop Assessment: no apparent nausea or vomiting Anesthetic complications: no     Last Vitals:  Vitals:   02/25/18 1236 02/25/18 1246  BP: 116/70 (!) 111/58  Pulse: 60 (!) 48  Resp: 17 10  Temp:    SpO2: 100% 99%    Last Pain:  Vitals:   02/25/18 1246  TempSrc:   PainSc: 0-No pain                 Martha Clan

## 2018-02-25 NOTE — H&P (Signed)
Cephas Darby, MD [address]  [place]  [place], [place] [address]  Main: [phone]  Fax: [phone] Pager: [phone]  Primary Care Physician:  Chancy Milroy Primary Gastroenterologist:  Dr. Cephas Darby  Pre-Procedure History & Physical: HPI:  Fred Cordova is a 67 y.o. male is here for an colonoscopy.   Past Medical History:  Diagnosis Date  . Erectile dysfunction   . Hyperlipidemia   . Hypertension     Past Surgical History:  Procedure Laterality Date  . COLONOSCOPY  2012   [facility]    Prior to Admission medications   Medication Sig Start Date End Date Taking? Authorizing Provider  aspirin 81 MG tablet Take 1 tablet (81 mg total) by mouth daily. 06/28/15  Yes Juline Patch, MD  hydrochlorothiazide (HYDRODIURIL) 25 MG tablet Take 1 tablet (25 mg total) by mouth daily. 12/08/16  Yes Juline Patch, MD  pravastatin (PRAVACHOL) 80 MG tablet Take 40 mg by mouth daily.   Yes [provider]  sildenafil (VIAGRA) 100 MG tablet Take 0.5 tablets (50 mg total) by mouth daily as needed for erectile dysfunction. 12/08/16  Yes Juline Patch, MD  lovastatin (MEVACOR) 20 MG tablet Take 1 tablet (20 mg total) by mouth at bedtime. Patient not taking: Reported on 02/25/2018 12/08/16   Juline Patch, MD    Allergies as of 02/10/2018  . (No Known Allergies)    History reviewed. No pertinent family history.  Social History   Socioeconomic History  . Marital status: Single    Spouse name: Not on file  . Number of children: Not on file  . Years of education: Not on file  . Highest education level: Not on file  Occupational History  . Not on file  Social Needs  . Financial resource strain: Not on file  . Food insecurity:    Worry: Not on file    Inability: Not on file  . Transportation needs:    Medical: Not on file    Non-medical: Not on file  Tobacco Use  . Smoking status: Former Research scientist (life sciences)  . Smokeless tobacco: Never Used    Substance and Sexual Activity  . Alcohol use: Yes    Alcohol/week: 0.0 standard drinks  . Drug use: Yes    Types: Marijuana    Comment: 2-3 months ago  . Sexual activity: Never  Lifestyle  . Physical activity:    Days per week: Not on file    Minutes per session: Not on file  . Stress: Not on file  Relationships  . Social connections:    Talks on phone: Not on file    Gets together: Not on file    Attends religious service: Not on file    Active member of club or organization: Not on file    Attends meetings of clubs or organizations: Not on file    Relationship status: Not on file  . Intimate partner violence:    Fear of current or ex partner: Not on file    Emotionally abused: Not on file    Physically abused: Not on file    Forced sexual activity: Not on file  Other Topics Concern  . Not on file  Social History Narrative  . Not on file    Review of Systems: See HPI, otherwise negative ROS  Physical Exam: BP (!) 156/86   Pulse (!) 47   Temp (!) 96.3 F (35.7 C) (Tympanic)   Resp  18   Ht 5\' 9"  (1.753 m)   Wt 77.1 kg   SpO2 100%   BMI 25.10 kg/m  General:   Alert,  pleasant and cooperative in NAD Head:  Normocephalic and atraumatic. Neck:  Supple; no masses or thyromegaly. Lungs:  Clear throughout to auscultation.    Heart:  Regular rate and rhythm. Abdomen:  Soft, nontender and nondistended. Normal bowel sounds, without guarding, and without rebound.   Neurologic:  Alert and  oriented x4;  grossly normal neurologically.  Impression/Plan: Fred Cordova is here for an colonoscopy to be performed for colon cancer screening  Risks, benefits, limitations, and alternatives regarding  colonoscopy have been reviewed with the patient.  Questions have been answered.  All parties agreeable.   Sherri Sear, MD  02/25/2018, 10:54 AM

## 2018-02-25 NOTE — Op Note (Signed)
[facility] Patient Name: Fred Cordova Procedure Date: 02/25/2018 11:47 AM MRN: [id number] Account #: [id number] Date of Birth: 11/22/1950 Admit Type: Outpatient Age: 67 Room: [facility] ENDO ROOM 3 Gender: Male Note Status: Finalized Procedure:            Colonoscopy Indications:          High risk colon cancer surveillance: Personal history                        of colonic polyps, Last colonoscopy: November 2008 Providers:            Lin Landsman MD, MD Medicines:            Monitored Anesthesia Care Complications:        No immediate complications. Estimated blood loss: None. Procedure:            Pre-Anesthesia Assessment:                       - Prior to the procedure, a History and Physical was                        performed, and patient medications and allergies were                        reviewed. The patient is competent. The risks and                        benefits of the procedure and the sedation options and                        risks were discussed with the patient. All questions                        were answered and informed consent was obtained.                        Patient identification and proposed procedure were                        verified by the physician, the nurse, the                        anesthesiologist, the anesthetist and the technician in                        the pre-procedure area in the procedure room in the                        endoscopy suite. Mental Status Examination: alert and                        oriented. Airway Examination: normal oropharyngeal                        airway and neck mobility. Respiratory Examination:                        clear to auscultation. CV Examination: normal.  Prophylactic Antibiotics: The patient does not require                        prophylactic antibiotics. Prior Anticoagulants: The                        patient has taken no  previous anticoagulant or                        antiplatelet agents. ASA Grade Assessment: II - A                        patient with mild systemic disease. After reviewing the                        risks and benefits, the patient was deemed in                        satisfactory condition to undergo the procedure. The                        anesthesia plan was to use monitored anesthesia care                        (MAC). Immediately prior to administration of                        medications, the patient was re-assessed for adequacy                        to receive sedatives. The heart rate, respiratory rate,                        oxygen saturations, blood pressure, adequacy of                        pulmonary ventilation, and response to care were                        monitored throughout the procedure. The physical status                        of the patient was re-assessed after the procedure.                       After obtaining informed consent, the colonoscope was                        passed under direct vision. Throughout the procedure,                        the patient's blood pressure, pulse, and oxygen                        saturations were monitored continuously. The                        Colonoscope was introduced through the anus and  advanced to the the terminal ileum. The colonoscopy was                        somewhat difficult due to inadequate bowel prep.                        Successful completion of the procedure was aided by                        lavage. The patient tolerated the procedure well. The                        quality of the bowel preparation was evaluated using                        the BBPS [facility] Bowel Preparation Scale) with scores                        of: Right Colon = 3, Transverse Colon = 3 and Left                        Colon = 3 (entire mucosa seen well with no residual                        staining,  small fragments of stool or opaque liquid).                        The total BBPS score equals 9. Findings:      The perianal and digital rectal examinations were normal. Pertinent       negatives include normal sphincter tone and no palpable rectal lesions.      The terminal ileum appeared normal.      A 5 mm polyp was found in the descending colon. The polyp was sessile.       The polyp was removed with a cold biopsy forceps. Resection and       retrieval were complete.      A 5 mm polyp was found in the recto-sigmoid colon. The polyp was       sessile. The polyp was removed with a cold snare. Resection and       retrieval were complete.      Non-bleeding internal hemorrhoids were found during retroflexion. The       hemorrhoids were large.      The exam was otherwise without abnormality. Impression:           - The examined portion of the ileum was normal.                       - One 5 mm polyp in the descending colon, removed with                        a cold biopsy forceps. Resected and retrieved.                       - One 5 mm polyp at the recto-sigmoid colon, removed                        with a cold snare. Resected and retrieved.                       -  Non-bleeding internal hemorrhoids.                       - The examination was otherwise normal. Recommendation:       - Discharge patient to home (with escort).                       - Resume previous diet today.                       - Continue present medications.                       - Await pathology results.                       - Repeat colonoscopy in 5-10 years for surveillance                        based on pathology results. Procedure Code(s):    --- Professional ---                       [phone], Colonoscopy, flexible; with removal of tumor(s),                        polyp(s), or other lesion(s) by snare technique                       45380, 47, Colonoscopy, flexible; with biopsy, single                         or multiple Diagnosis Code(s):    --- Professional ---                       Z86.010, Personal history of colonic polyps                       K64.8, Other hemorrhoids                       D12.4, Benign neoplasm of descending colon                       D12.7, Benign neoplasm of rectosigmoid junction CPT copyright 2017 American Medical Association. All rights reserved. The codes documented in this report are preliminary and upon coder review may  be revised to meet current compliance requirements. Dr. Ulyess Mort Lin Landsman MD, MD 02/25/2018 12:17:29 PM This report has been signed electronically. Number of Addenda: 0 Note Initiated On: 02/25/2018 11:47 AM Scope Withdrawal Time: 0 hours 17 minutes 40 seconds  Total Procedure Duration: 0 hours 20 minutes 15 seconds       [facility]

## 2018-02-25 NOTE — Anesthesia Preprocedure Evaluation (Signed)
Anesthesia Evaluation  Patient identified by MRN, date of birth, ID band Patient awake    Reviewed: Allergy & Precautions, H&P , NPO status , Patient's Chart, lab work & pertinent test results, reviewed documented beta blocker date and time   Airway Mallampati: I  TM Distance: >3 FB Neck ROM: full    Dental  (+) Dental Advidsory Given, Missing, Teeth Intact   Pulmonary neg shortness of breath, asthma , neg COPD, neg recent URI, former smoker,           Cardiovascular Exercise Tolerance: Good hypertension, (-) angina(-) CAD, (-) Past MI, (-) Cardiac Stents and (-) CABG (-) dysrhythmias (-) Valvular Problems/Murmurs     Neuro/Psych negative neurological ROS  negative psych ROS   GI/Hepatic negative GI ROS, Neg liver ROS,   Endo/Other  negative endocrine ROS  Renal/GU negative Renal ROS  negative genitourinary   Musculoskeletal   Abdominal   Peds  Hematology negative hematology ROS (+)   Anesthesia Other Findings Past Medical History: No date: Erectile dysfunction No date: Hyperlipidemia No date: Hypertension   Reproductive/Obstetrics negative OB ROS                             Anesthesia Physical Anesthesia Plan  ASA: II  Anesthesia Plan: General   Post-op Pain Management:    Induction: Intravenous  PONV Risk Score and Plan: 2 and Propofol infusion and TIVA  Airway Management Planned: Nasal Cannula  Additional Equipment:   Intra-op Plan:   Post-operative Plan:   Informed Consent: I have reviewed the patients History and Physical, chart, labs and discussed the procedure including the risks, benefits and alternatives for the proposed anesthesia with the patient or authorized representative who has indicated his/her understanding and acceptance.   Dental Advisory Given  Plan Discussed with: Anesthesiologist, CRNA and Surgeon  Anesthesia Plan Comments:          Anesthesia Quick Evaluation

## 2018-02-26 LAB — SURGICAL PATHOLOGY

## 2018-03-01 ENCOUNTER — Encounter: Payer: Self-pay | Admitting: Gastroenterology

## 2018-12-31 ENCOUNTER — Other Ambulatory Visit: Payer: Self-pay

## 2022-11-14 ENCOUNTER — Ambulatory Visit
Admission: RE | Admit: 2022-11-14 | Discharge: 2022-11-14 | Disposition: A | Discharge status: Home / Self Care | Payer: Medicare HMO | Source: Ambulatory Visit | Attending: Internal Medicine | Admitting: Internal Medicine

## 2022-11-14 ENCOUNTER — Other Ambulatory Visit: Payer: Self-pay | Admitting: Internal Medicine

## 2022-11-14 DIAGNOSIS — M542 Cervicalgia: Secondary | ICD-10-CM

## 2023-06-09 ENCOUNTER — Other Ambulatory Visit: Payer: Self-pay | Admitting: Internal Medicine

## 2023-06-09 DIAGNOSIS — S99919A Unspecified injury of unspecified ankle, initial encounter: Secondary | ICD-10-CM

## 2023-06-10 ENCOUNTER — Emergency Department: Payer: Non-veteran care

## 2023-06-10 ENCOUNTER — Ambulatory Visit
Admission: RE | Admit: 2023-06-10 | Discharge: 2023-06-10 | Disposition: A | Discharge status: Home / Self Care | Payer: Medicare Other | Source: Ambulatory Visit | Attending: Internal Medicine | Admitting: Internal Medicine

## 2023-06-10 ENCOUNTER — Ambulatory Visit
Admission: RE | Admit: 2023-06-10 | Discharge: 2023-06-10 | Disposition: A | Discharge status: Home / Self Care | Payer: Medicare Other | Attending: Internal Medicine | Admitting: Internal Medicine

## 2023-06-10 ENCOUNTER — Emergency Department
Admission: EM | Admit: 2023-06-10 | Discharge: 2023-06-10 | Disposition: A | Discharge status: Home / Self Care | Payer: Non-veteran care | Attending: Emergency Medicine | Admitting: Emergency Medicine

## 2023-06-10 ENCOUNTER — Other Ambulatory Visit: Payer: Self-pay

## 2023-06-10 DIAGNOSIS — I10 Essential (primary) hypertension: Secondary | ICD-10-CM | POA: Insufficient documentation

## 2023-06-10 DIAGNOSIS — S8255XA Nondisplaced fracture of medial malleolus of left tibia, initial encounter for closed fracture: Secondary | ICD-10-CM | POA: Insufficient documentation

## 2023-06-10 DIAGNOSIS — M25571 Pain in right ankle and joints of right foot: Secondary | ICD-10-CM | POA: Diagnosis present

## 2023-06-10 DIAGNOSIS — S99919A Unspecified injury of unspecified ankle, initial encounter: Secondary | ICD-10-CM

## 2023-06-10 DIAGNOSIS — W108XXA Fall (on) (from) other stairs and steps, initial encounter: Secondary | ICD-10-CM | POA: Diagnosis not present

## 2023-06-10 NOTE — ED Provider Notes (Signed)
 [facility] Provider Note    Event Date/Time   First MD Initiated Contact with Patient 06/10/23 1642     (approximate)   History   Ankle Pain   HPI  Fred Cordova is a 73 y.o. male with a past medical history of hypertension, hyperlipidemia presents today for evaluation of right ankle pain.  Patient reports that he tripped on 1 stair 4 days ago and has had pain to his medial ankle ever since.  He denies numbness or tingling.  There is no head strike or LOC.  He has not had any other injury sustained.  He is not anticoagulated.  Patient Active Problem List   Diagnosis Date Noted   Encounter for screening colonoscopy    Erectile dysfunction due to arterial insufficiency 06/28/2015   Hypertension 11/28/2014   Hyperlipidemia 11/28/2014          Physical Exam   Triage Vital Signs: ED Triage Vitals  Encounter Vitals Group     BP 06/10/23 1534 (!) 152/76     Systolic BP Percentile --      Diastolic BP Percentile --      Pulse Rate 06/10/23 1532 72     Resp 06/10/23 1532 17     Temp 06/10/23 1532 98 F (36.7 C)     Temp Source 06/10/23 1532 Oral     SpO2 06/10/23 1532 100 %     Weight 06/10/23 1533 180 lb (81.6 kg)     Height 06/10/23 1533 5' 9 (1.753 m)     Head Circumference --      Peak Flow --      Pain Score 06/10/23 1533 7     Pain Loc --      Pain Education --      Exclude from Growth Chart --     Most recent vital signs: Vitals:   06/10/23 1532 06/10/23 1534  BP:  (!) 152/76  Pulse: 72   Resp: 17   Temp: 98 F (36.7 C)   SpO2: 100%     Physical Exam Vitals and nursing note reviewed.  Constitutional:      General: Awake and alert. No acute distress.    Appearance: Normal appearance. The patient is normal weight.  HENT:     Head: Normocephalic and atraumatic.     Mouth: Mucous membranes are moist.  Eyes:     General: PERRL. Normal EOMs        Right eye: No discharge.        Left eye: No discharge.      Conjunctiva/sclera: Conjunctivae normal.  Cardiovascular:     Rate and Rhythm: Normal rate and regular rhythm.     Pulses: Normal pulses.  Pulmonary:     Effort: Pulmonary effort is normal. No respiratory distress.     Breath sounds: Normal breath sounds.  Abdominal:     Abdomen is soft. There is no abdominal tenderness. No rebound or guarding. No distention. Musculoskeletal:        General: No swelling. Normal range of motion.     Cervical back: Normal range of motion and neck supple.  Left ankle: Tenderness and swelling over the anterior talofibular ligament and medial malleolus. No proximal fifth metacarpal tenderness. No proximal fibular tenderness. 2+ pedal pulses with brisk capillary refill. Intact distal sensation and strength with normal ROM. Able to plantar flex and dorsiflex against resistance. Able to invert and evert against resistance. Negative Thompson test Skin:    General:  Skin is warm and dry.     Capillary Refill: Capillary refill takes less than 2 seconds.     Findings: No rash.  Neurological:     Mental Status: The patient is awake and alert.      ED Results / Procedures / Treatments   Labs (all labs ordered are listed, but only abnormal results are displayed) Labs Reviewed - No data to display   EKG     RADIOLOGY I independently reviewed and interpreted imaging and agree with radiologists findings.     PROCEDURES:  Critical Care performed:   Procedures   MEDICATIONS ORDERED IN ED: Medications - No data to display   IMPRESSION / MDM / ASSESSMENT AND PLAN / ED COURSE  I reviewed the triage vital signs and the nursing notes.   Differential diagnosis includes, but is not limited to, ankle fracture, ankle sprain, ligamental injury.  Patient is awake and alert, hemodynamically stable and afebrile.  He is nontoxic in appearance.  He has normal 2+ pedal pulses, sensation is intact to light touch throughout.  He has mild tenderness over his medial  malleolus and anterior ankle.  X-ray obtained in triage reveals a acute mildly displaced distal tibial fracture which appears to involve the metaphysis and medial malleolus.  This was discussed with Dr. Malvin, podiatry on-call who agrees with plan for posterior and stirrup splint, nonweightbearing status, and he will follow-up with patient in clinic.  I discussed splint care with the patient, and the importance of elevation.  He was instructed to remain nonweightbearing.  We discussed return precautions.  Patient declines any analgesia, reports that his pain is well-controlled.  He was discharged in stable condition.   Patient's presentation is most consistent with acute complicated illness / injury requiring diagnostic workup.   Clinical Course as of 06/10/23 1846  Wed Jun 10, 2023  1656 Secure chat sent to Dr. Malvin [JP]  [phone] Dr. Malvin agrees with posterior and stirrup splint and nonweightbearing status and he will follow-up with the patient in clinic [JP]    Clinical Course User Index [JP] Yechezkel Fertig E, PA-C     FINAL CLINICAL IMPRESSION(S) / ED DIAGNOSES   Final diagnoses:  Closed nondisplaced fracture of medial malleolus of left tibia, initial encounter     Rx / DC Orders   ED Discharge Orders     None        Note:  This document was prepared using Dragon voice recognition software and may include unintentional dictation errors.   Dandria Griego E, PA-C 06/10/23 1846    Dorothyann Drivers, MD 06/15/23 2256

## 2023-06-10 NOTE — Discharge Instructions (Signed)
 Your x-ray shows that you have a fracture of your tibia.  This was discussed with the podiatrist on-call who would like to see you in follow-up.  Please keep your splint clean and dry.  Keep your leg elevated is much as possible.  Please remain nonweightbearing.  Please return for any new, worsening, or change in symptoms or other concerns.  It was a pleasure caring for you today.

## 2023-06-10 NOTE — ED Triage Notes (Signed)
 Pt sts that he fell off of his stairs this past Saturday and since than he has been having right ankle pain.

## 2023-06-10 NOTE — ED Notes (Signed)
 Crutches given to pt.  Splint applied to right  leg

## 2023-06-12 ENCOUNTER — Ambulatory Visit: Payer: Self-pay | Admitting: Podiatry

## 2024-02-11 ENCOUNTER — Other Ambulatory Visit: Payer: Self-pay | Admitting: Internal Medicine

## 2024-02-11 DIAGNOSIS — Z136 Encounter for screening for cardiovascular disorders: Secondary | ICD-10-CM

## 2024-02-18 ENCOUNTER — Ambulatory Visit
Admission: RE | Admit: 2024-02-18 | Discharge: 2024-02-18 | Disposition: A | Discharge status: Home / Self Care | Source: Ambulatory Visit | Attending: Internal Medicine | Admitting: Internal Medicine

## 2024-02-18 DIAGNOSIS — Z136 Encounter for screening for cardiovascular disorders: Secondary | ICD-10-CM | POA: Diagnosis present
